# Patient Record
Sex: Male | Born: 1945
Health system: Southern US, Community
[De-identification: ages and names within clinical notes are randomized; demographics above are authoritative.]

## PROBLEM LIST (undated history)

## (undated) DIAGNOSIS — J449 Chronic obstructive pulmonary disease, unspecified: Secondary | ICD-10-CM

## (undated) DIAGNOSIS — I10 Essential (primary) hypertension: Secondary | ICD-10-CM

## (undated) DIAGNOSIS — K5790 Diverticulosis of intestine, part unspecified, without perforation or abscess without bleeding: Secondary | ICD-10-CM

## (undated) DIAGNOSIS — K4090 Unilateral inguinal hernia, without obstruction or gangrene, not specified as recurrent: Secondary | ICD-10-CM

## (undated) DIAGNOSIS — N189 Chronic kidney disease, unspecified: Secondary | ICD-10-CM

## (undated) DIAGNOSIS — IMO0001 Reserved for inherently not codable concepts without codable children: Secondary | ICD-10-CM

## (undated) DIAGNOSIS — Z87442 Personal history of urinary calculi: Secondary | ICD-10-CM

## (undated) HISTORY — PX: LITHOTRIPSY: SUR834

## (undated) HISTORY — PX: EYE SURGERY: SHX253

## (undated) HISTORY — PX: COLONOSCOPY: SHX174

---

## 2008-04-14 ENCOUNTER — Ambulatory Visit: Payer: Self-pay | Admitting: General Practice

## 2014-05-15 DIAGNOSIS — F17200 Nicotine dependence, unspecified, uncomplicated: Secondary | ICD-10-CM | POA: Insufficient documentation

## 2014-05-15 DIAGNOSIS — I10 Essential (primary) hypertension: Secondary | ICD-10-CM | POA: Insufficient documentation

## 2014-07-04 ENCOUNTER — Ambulatory Visit: Payer: Self-pay | Admitting: Gastroenterology

## 2014-12-08 DIAGNOSIS — N529 Male erectile dysfunction, unspecified: Secondary | ICD-10-CM | POA: Insufficient documentation

## 2015-02-03 ENCOUNTER — Encounter
Admission: RE | Admit: 2015-02-03 | Discharge: 2015-02-03 | Disposition: A | Discharge status: Home / Self Care | Payer: PPO | Source: Ambulatory Visit | Attending: Ophthalmology | Admitting: Ophthalmology

## 2015-02-03 ENCOUNTER — Encounter: Payer: Self-pay | Admitting: *Deleted

## 2015-02-03 DIAGNOSIS — Z87442 Personal history of urinary calculi: Secondary | ICD-10-CM | POA: Diagnosis not present

## 2015-02-03 DIAGNOSIS — I10 Essential (primary) hypertension: Secondary | ICD-10-CM | POA: Diagnosis not present

## 2015-02-03 DIAGNOSIS — Z79899 Other long term (current) drug therapy: Secondary | ICD-10-CM | POA: Diagnosis not present

## 2015-02-03 DIAGNOSIS — Z7982 Long term (current) use of aspirin: Secondary | ICD-10-CM | POA: Diagnosis not present

## 2015-02-03 DIAGNOSIS — H35341 Macular cyst, hole, or pseudohole, right eye: Secondary | ICD-10-CM | POA: Diagnosis present

## 2015-02-03 DIAGNOSIS — K5790 Diverticulosis of intestine, part unspecified, without perforation or abscess without bleeding: Secondary | ICD-10-CM | POA: Diagnosis not present

## 2015-02-03 DIAGNOSIS — F17200 Nicotine dependence, unspecified, uncomplicated: Secondary | ICD-10-CM | POA: Diagnosis not present

## 2015-02-03 DIAGNOSIS — Z01810 Encounter for preprocedural cardiovascular examination: Secondary | ICD-10-CM | POA: Diagnosis not present

## 2015-02-03 DIAGNOSIS — R0602 Shortness of breath: Secondary | ICD-10-CM | POA: Diagnosis not present

## 2015-02-03 DIAGNOSIS — J449 Chronic obstructive pulmonary disease, unspecified: Secondary | ICD-10-CM | POA: Diagnosis not present

## 2015-02-10 MED ORDER — PHENYLEPHRINE HCL 10 % OP SOLN: 1.0000 [drp] | OPHTHALMIC | Status: DC | PRN

## 2015-02-10 MED ORDER — CYCLOPENTOLATE HCL 2 % OP SOLN: 1.0000 [drp] | OPHTHALMIC | Status: DC | PRN

## 2015-02-11 ENCOUNTER — Ambulatory Visit: Payer: PPO | Admitting: Certified Registered Nurse Anesthetist

## 2015-02-11 ENCOUNTER — Ambulatory Visit
Admission: RE | Admit: 2015-02-11 | Discharge: 2015-02-11 | Disposition: A | Discharge status: Home / Self Care | Payer: PPO | Source: Ambulatory Visit | Attending: Ophthalmology | Admitting: Ophthalmology

## 2015-02-11 ENCOUNTER — Encounter
Admission: RE | Disposition: A | Discharge status: Home / Self Care | Payer: Self-pay | Source: Ambulatory Visit | Attending: Ophthalmology

## 2015-02-11 ENCOUNTER — Encounter: Payer: Self-pay | Admitting: *Deleted

## 2015-02-11 DIAGNOSIS — Z79899 Other long term (current) drug therapy: Secondary | ICD-10-CM | POA: Insufficient documentation

## 2015-02-11 DIAGNOSIS — J449 Chronic obstructive pulmonary disease, unspecified: Secondary | ICD-10-CM | POA: Insufficient documentation

## 2015-02-11 DIAGNOSIS — H35341 Macular cyst, hole, or pseudohole, right eye: Secondary | ICD-10-CM | POA: Diagnosis not present

## 2015-02-11 DIAGNOSIS — F17200 Nicotine dependence, unspecified, uncomplicated: Secondary | ICD-10-CM | POA: Insufficient documentation

## 2015-02-11 DIAGNOSIS — R0602 Shortness of breath: Secondary | ICD-10-CM | POA: Insufficient documentation

## 2015-02-11 DIAGNOSIS — K5790 Diverticulosis of intestine, part unspecified, without perforation or abscess without bleeding: Secondary | ICD-10-CM | POA: Insufficient documentation

## 2015-02-11 DIAGNOSIS — I10 Essential (primary) hypertension: Secondary | ICD-10-CM | POA: Insufficient documentation

## 2015-02-11 DIAGNOSIS — Z87442 Personal history of urinary calculi: Secondary | ICD-10-CM | POA: Insufficient documentation

## 2015-02-11 DIAGNOSIS — Z7982 Long term (current) use of aspirin: Secondary | ICD-10-CM | POA: Insufficient documentation

## 2015-02-11 HISTORY — PX: GAS/FLUID EXCHANGE: SHX5334

## 2015-02-11 HISTORY — DX: Chronic kidney disease, unspecified: N18.9

## 2015-02-11 HISTORY — PX: MEMBRANE PEEL: SHX5967

## 2015-02-11 HISTORY — DX: Reserved for inherently not codable concepts without codable children: IMO0001

## 2015-02-11 HISTORY — DX: Essential (primary) hypertension: I10

## 2015-02-11 HISTORY — DX: Chronic obstructive pulmonary disease, unspecified: J44.9

## 2015-02-11 HISTORY — PX: PARS PLANA VITRECTOMY W/ SCLERAL BUCKLE: SHX2171

## 2015-02-11 SURGERY — PARS PLANA VITRECTOMY WITH LASER FOR MACULAR HOLE
Anesthesia: Monitor Anesthesia Care | Laterality: Right | Wound class: Clean

## 2015-02-11 MED ORDER — BUPIVACAINE HCL (PF) 0.75 % IJ SOLN
INTRAMUSCULAR | Status: AC
  Filled 2015-02-11: qty 10

## 2015-02-11 MED ORDER — ONDANSETRON HCL 4 MG/2ML IJ SOLN: 4.0000 mg | Freq: Once | INTRAMUSCULAR | Status: DC | PRN

## 2015-02-11 MED ORDER — ALFENTANIL 500 MCG/ML IJ INJ
INJECTION | INTRAMUSCULAR | Status: DC | PRN
  Administered 2015-02-11: 250 ug via INTRAVENOUS

## 2015-02-11 MED ORDER — CYCLOPENTOLATE HCL 2 % OP SOLN
OPHTHALMIC | Status: AC
  Administered 2015-02-11: 1 [drp] via OPHTHALMIC
  Filled 2015-02-11: qty 2

## 2015-02-11 MED ORDER — PHENYLEPHRINE HCL 10 % OP SOLN
OPHTHALMIC | Status: AC
  Administered 2015-02-11: 1 [drp] via OPHTHALMIC
  Filled 2015-02-11: qty 5

## 2015-02-11 MED ORDER — CEFUROXIME OPHTHALMIC INJECTION 1 MG/0.1 ML
INJECTION | OPHTHALMIC | Status: AC
  Filled 2015-02-11: qty 0.1

## 2015-02-11 MED ORDER — ATROPINE SULFATE 1 % OP SOLN
OPHTHALMIC | Status: AC
  Filled 2015-02-11: qty 5

## 2015-02-11 MED ORDER — MIDAZOLAM HCL 2 MG/2ML IJ SOLN
INTRAMUSCULAR | Status: DC | PRN
  Administered 2015-02-11 (×2): 1 mg via INTRAVENOUS

## 2015-02-11 MED ORDER — ATROPINE SULFATE 1 % OP SOLN
OPHTHALMIC | Status: DC | PRN
  Administered 2015-02-11: 2 [drp] via OPHTHALMIC

## 2015-02-11 MED ORDER — NEOMYCIN-POLYMYXIN-DEXAMETH 0.1 % OP OINT
TOPICAL_OINTMENT | OPHTHALMIC | Status: DC | PRN
  Administered 2015-02-11: 1 via OPHTHALMIC

## 2015-02-11 MED ORDER — TETRACAINE HCL 0.5 % OP SOLN
OPHTHALMIC | Status: AC
  Filled 2015-02-11: qty 2

## 2015-02-11 MED ORDER — FENTANYL CITRATE (PF) 100 MCG/2ML IJ SOLN: 25.0000 ug | INTRAMUSCULAR | Status: DC | PRN

## 2015-02-11 MED ORDER — DEXAMETHASONE SODIUM PHOSPHATE 10 MG/ML IJ SOLN
INTRAMUSCULAR | Status: AC
  Filled 2015-02-11: qty 1

## 2015-02-11 MED ORDER — PHENYLEPHRINE HCL 10 % OP SOLN
1.0000 [drp] | OPHTHALMIC | Status: AC | PRN
  Administered 2015-02-11 (×3): 1 [drp] via OPHTHALMIC

## 2015-02-11 MED ORDER — INDOCYANINE GREEN 25 MG IV SOLR
25.0000 mg | Freq: Once | INTRAVENOUS | Status: DC
  Filled 2015-02-11: qty 25

## 2015-02-11 MED ORDER — LIDOCAINE HCL (PF) 4 % IJ SOLN
INTRAMUSCULAR | Status: DC | PRN
  Administered 2015-02-11: 5 mL via OPHTHALMIC

## 2015-02-11 MED ORDER — SODIUM CHLORIDE 0.9 % IV SOLN
INTRAVENOUS | Status: DC
  Administered 2015-02-11: 08:00:00 via INTRAVENOUS

## 2015-02-11 MED ORDER — BSS PLUS IO SOLN
Freq: Once | INTRAOCULAR | Status: DC
  Filled 2015-02-11: qty 500

## 2015-02-11 MED ORDER — CEFUROXIME OPHTHALMIC INJECTION 1 MG/0.1 ML
INJECTION | OPHTHALMIC | Status: DC | PRN
  Administered 2015-02-11: 0.1 mL via INTRACAMERAL

## 2015-02-11 MED ORDER — INDOCYANINE GREEN 25 MG IV SOLR
INTRAVENOUS | Status: DC | PRN
  Administered 2015-02-11: 2.5 mg

## 2015-02-11 MED ORDER — DEXAMETHASONE SODIUM PHOSPHATE 10 MG/ML IJ SOLN
INTRAMUSCULAR | Status: DC | PRN
  Administered 2015-02-11: 5 mg

## 2015-02-11 MED ORDER — HYALURONIDASE HUMAN 150 UNIT/ML IJ SOLN
INTRAMUSCULAR | Status: AC
Start: 2015-02-11 — End: 2015-02-11
  Filled 2015-02-11: qty 1

## 2015-02-11 MED ORDER — LIDOCAINE HCL (PF) 4 % IJ SOLN
INTRAMUSCULAR | Status: AC
  Filled 2015-02-11: qty 5

## 2015-02-11 MED ORDER — CYCLOPENTOLATE HCL 2 % OP SOLN
1.0000 [drp] | OPHTHALMIC | Status: AC | PRN
  Administered 2015-02-11 (×3): 1 [drp] via OPHTHALMIC

## 2015-02-11 SURGICAL SUPPLY — 31 items
CANNULA SOFT TIP 25G (CANNULA) ×3 IMPLANT
CORD BIP STRL DISP 12FT (MISCELLANEOUS) ×3 IMPLANT
CUP MEDICINE 2OZ PLAST GRAD ST (MISCELLANEOUS) ×3 IMPLANT
DRAPE XRAY CASSETTE 23X24 (DRAPES) ×6 IMPLANT
ERASER HMR WETFIELD 25G (MISCELLANEOUS) ×3 IMPLANT
FILTER MILLEX .045 (MISCELLANEOUS) ×1 IMPLANT
FLTR MILLEX .045 (MISCELLANEOUS) ×3
FORCEPS GRIESH GRASP 25G (INSTRUMENTS) ×3 IMPLANT
FORCEPS GRIESH ILM PLUS 25G (INSTRUMENTS) ×3 IMPLANT
GLOVE BIO SURGEON STRL SZ8 (GLOVE) ×3 IMPLANT
GLOVE SURG LX 6.5 MICRO (GLOVE) ×2
GLOVE SURG LX STRL 6.5 MICRO (GLOVE) ×1 IMPLANT
GOWN STRL REUS W/ TWL LRG LVL3 (GOWN DISPOSABLE) ×2 IMPLANT
GOWN STRL REUS W/TWL LRG LVL3 (GOWN DISPOSABLE) ×4
IV SET STOPCOCK EXT 40 2IN (SET/KITS/TRAYS/PACK) ×3 IMPLANT
LENS BIOM OPTIC SET 200MM DISP (MISCELLANEOUS) ×3 IMPLANT
LENS VITRECTOMY FLAT DISP (MISCELLANEOUS) ×3 IMPLANT
NDL RETROBULBAR .5 NSTRL (NEEDLE) ×3 IMPLANT
NDL SAFETY 25GX1.5 (NEEDLE) ×3 IMPLANT
NEEDLE HYPO 30X.5 LL (NEEDLE) ×3 IMPLANT
PACK EYE AFTER SURG (MISCELLANEOUS) ×3 IMPLANT
PACK VITRECTOMY (MISCELLANEOUS) ×3 IMPLANT
PACK VITRECTOMY CASSETTE 25GA (MISCELLANEOUS) ×3 IMPLANT
PROBE DIRECTIONAL LASER (MISCELLANEOUS) ×3 IMPLANT
SOL PREP PVP 2OZ (MISCELLANEOUS) ×3
SOLUTION PREP PVP 2OZ (MISCELLANEOUS) ×1 IMPLANT
STRAP SAFETY BODY (MISCELLANEOUS) ×3 IMPLANT
SYR 3ML LL SCALE MARK (SYRINGE) ×6 IMPLANT
SYR 50ML LL SCALE MARK (SYRINGE) ×2 IMPLANT
SYR TB 1ML 27GX1/2 LL (SYRINGE) ×3 IMPLANT
SYR TB 1ML LUER SLIP (SYRINGE) ×3 IMPLANT

## 2015-02-11 NOTE — Transfer of Care (Signed)
Immediate Anesthesia Transfer of Care Note  Patient: Manuel Bowen.  Procedure(s) Performed: Procedure(s) with comments: GAS/FLUID EXCHANGE (Right) - 24%  SF6 MEMBRANE PEEL (Right) PARS PLANA VITRECTOMY WITH LASER FOR MACULAR HOLE (Right) - Power200 Duration200 Interval200 Shot count117 Total energy 4.61 joules  Patient Location: PACU  Anesthesia Type:MAC  Level of Consciousness: awake, alert  and oriented  Airway & Oxygen Therapy: Patient Spontanous Breathing  Post-op Assessment: Report given to RN and Post -op Vital signs reviewed and stable  Post vital signs: Reviewed and stable  Last Vitals:  Filed Vitals:   02/11/15 0934  BP: 102/54  Pulse: 70  Temp: 36.2 C  Resp: 21    Complications: No apparent anesthesia complications

## 2015-02-11 NOTE — Anesthesia Preprocedure Evaluation (Addendum)
Anesthesia Evaluation  Patient identified by MRN, date of birth, ID band Patient awake    Reviewed: Allergy & Precautions, NPO status , Patient's Chart, lab work & pertinent test results  Airway Mallampati: III       Dental no notable dental hx.    Pulmonary shortness of breath, COPDCurrent Smoker,  + rhonchi   + decreased breath sounds      Cardiovascular hypertension, Pt. on medications Normal cardiovascular exam    Neuro/Psych    GI/Hepatic negative GI ROS, Neg liver ROS,   Endo/Other  negative endocrine ROS  Renal/GU Renal disease     Musculoskeletal negative musculoskeletal ROS (+)   Abdominal Normal abdominal exam  (+)   Peds negative pediatric ROS (+)  Hematology negative hematology ROS (+)   Anesthesia Other Findings   Reproductive/Obstetrics negative OB ROS                            Anesthesia Physical Anesthesia Plan  ASA: III  Anesthesia Plan: MAC   Post-op Pain Management:    Induction: Intravenous  Airway Management Planned: Nasal Cannula  Additional Equipment:   Intra-op Plan:   Post-operative Plan:   Informed Consent: I have reviewed the patients History and Physical, chart, labs and discussed the procedure including the risks, benefits and alternatives for the proposed anesthesia with the patient or authorized representative who has indicated his/her understanding and acceptance.     Plan Discussed with: CRNA  Anesthesia Plan Comments:         Anesthesia Quick Evaluation

## 2015-02-11 NOTE — Op Note (Signed)
Indications FOR SURGERY: The patient was evaluated in the clinic for a full thickness macular hole in the right eye.  The risks, benefits, alternatives, and complications were discussed with the patient, and he elected to proceed with pars plana vitrectomy.        PREOPERATIVE DIAGNOSIS: Macular hole, right eye.            POST OPERATIVE DIAGNOSIS: Macular hole, right eye.               OPERATION PERFORMED: 25-gauge pars plana vitrectomy with ICG, membrane peel, endolaser, air-fluid exchange, and 24% SF6.                  ANESTHESIA: MAC with retrobulbar block.   COMPLICATIONS: None.     BLOOD LOSS: Minimal.   DESCRIPTION OF PROCEDURE: On the day of surgery, the patient was greeted in  the preoperative holding area.  The right eye was marked.  The patient was then brought into the operating room and placed under monitored anesthesia care.  Next, 5 ml of a retrobulbar block consisting of 4% xylocaine plain, 0.75% sensorcaine plain and hylenex 100u was injected. The right eye was then prepped and draped in the usual sterile fashion.  Three trocars were used in the usual position. The infusion cannula was checked prior to starting the infusion to ensure it was in the correct position.  A core vitrectomy was performed. A PVD was present and so the vitreous was trimmed for 360 degrees to the periphery.   ICG was then applied to the retinal surface.  ICG forceps were used to peel the epiretinal membrane and internal limiting membrane for an area of two disc diameters around the fovea.  Attention was then drawn to the periphery.   A 360 degree scleral depression was performed.  There was a small area of retinal traction without a frank tear, but the decision was made to laser barricade prophylactically.  An air-fluid exchange was then performed and four times the vitreous volume of 24% SF6 was infused into the vitreous cavity.  The trocars were then removed.  The eye had an acceptable pressure by palpation.  The  wounds were airtight.  Subconjunctival cefuroxime and dexamethasone was injected.  The eye was then patched and shielded with Neo-Poly-Dex ointment and the patient was taken to the recovery area in stable condition.

## 2015-02-11 NOTE — H&P (Signed)
.  Previous H&P scanned in reviewed, patient examined, and no interval changes.  Please see scanned record for complete information.   

## 2015-02-11 NOTE — Discharge Instructions (Signed)
AMBULATORY SURGERY  °DISCHARGE INSTRUCTIONS ° ° °1) The drugs that you were given will stay in your system until tomorrow so for the next 24 hours you should not: ° °A) Drive an automobile °B) Make any legal decisions °C) Drink any alcoholic beverage ° ° °2) You may resume regular meals tomorrow.  Today it is better to start with liquids and gradually work up to solid foods. ° °You may eat anything you prefer, but it is better to start with liquids, then soup and crackers, and gradually work up to solid foods. ° ° °3) Please notify your doctor immediately if you have any unusual bleeding, trouble breathing, redness and pain at the surgery site, drainage, fever, or pain not relieved by medication. ° ° ° °4) Additional Instructions: ° ° ° ° ° ° ° °Please contact your physician with any problems or Same Day Surgery at [phone], Monday through Friday 6 am to 4 pm, or [place] [facility] [place] Main number at [phone].AMBULATORY SURGERY  °DISCHARGE INSTRUCTIONS ° ° °5) The drugs that you were given will stay in your system until tomorrow so for the next 24 hours you should not: ° °D) Drive an automobile °E) Make any legal decisions °F) Drink any alcoholic beverage ° ° °6) You may resume regular meals tomorrow.  Today it is better to start with liquids and gradually work up to solid foods. ° °You may eat anything you prefer, but it is better to start with liquids, then soup and crackers, and gradually work up to solid foods. ° ° °7) Please notify your doctor immediately if you have any unusual bleeding, trouble breathing, redness and pain at the surgery site, drainage, fever, or pain not relieved by medication. ° ° ° °8) Additional Instructions: ° ° ° ° ° ° ° °Please contact your physician with any problems or Same Day Surgery at [phone], Monday through Friday 6 am to 4 pm, or [facility] [place] Main number at [phone]. °

## 2015-02-11 NOTE — Anesthesia Postprocedure Evaluation (Signed)
  Anesthesia Post-op Note  Patient: Manuel Bowen.  Procedure(s) Performed: Procedure(s) with comments: GAS/FLUID EXCHANGE (Right) - 24%  SF6 MEMBRANE PEEL (Right) PARS PLANA VITRECTOMY WITH LASER FOR MACULAR HOLE (Right) - Power200 Duration200 Interval200 Shot count117 Total energy 4.61 joules  Anesthesia type:MAC  Patient location: PACU  Post pain: Pain level controlled  Post assessment: Post-op Vital signs reviewed, Patient's Cardiovascular Status Stable, Respiratory Function Stable, Patent Airway and No signs of Nausea or vomiting  Post vital signs: Reviewed and stable  Last Vitals:  Filed Vitals:   02/11/15 0945  BP: 104/52  Pulse: 60  Temp:   Resp: 18    Level of consciousness: awake, alert  and patient cooperative  Complications: No apparent anesthesia complications

## 2015-02-11 NOTE — OR Nursing (Signed)
[place]ente[place] Used

## 2015-06-23 ENCOUNTER — Encounter: Payer: Self-pay | Admitting: *Deleted

## 2015-06-25 ENCOUNTER — Ambulatory Visit: Payer: PPO | Admitting: Anesthesiology

## 2015-06-25 ENCOUNTER — Ambulatory Visit
Admission: RE | Admit: 2015-06-25 | Discharge: 2015-06-25 | Disposition: A | Discharge status: Home / Self Care | Payer: PPO | Source: Ambulatory Visit | Attending: Ophthalmology | Admitting: Ophthalmology

## 2015-06-25 ENCOUNTER — Encounter: Payer: Self-pay | Admitting: *Deleted

## 2015-06-25 ENCOUNTER — Encounter
Admission: RE | Disposition: A | Discharge status: Home / Self Care | Payer: Self-pay | Source: Ambulatory Visit | Attending: Ophthalmology

## 2015-06-25 DIAGNOSIS — J449 Chronic obstructive pulmonary disease, unspecified: Secondary | ICD-10-CM | POA: Insufficient documentation

## 2015-06-25 DIAGNOSIS — Z7982 Long term (current) use of aspirin: Secondary | ICD-10-CM | POA: Insufficient documentation

## 2015-06-25 DIAGNOSIS — F17200 Nicotine dependence, unspecified, uncomplicated: Secondary | ICD-10-CM | POA: Diagnosis not present

## 2015-06-25 DIAGNOSIS — Z87442 Personal history of urinary calculi: Secondary | ICD-10-CM | POA: Diagnosis not present

## 2015-06-25 DIAGNOSIS — H2511 Age-related nuclear cataract, right eye: Secondary | ICD-10-CM | POA: Insufficient documentation

## 2015-06-25 DIAGNOSIS — Z79899 Other long term (current) drug therapy: Secondary | ICD-10-CM | POA: Diagnosis not present

## 2015-06-25 DIAGNOSIS — K5730 Diverticulosis of large intestine without perforation or abscess without bleeding: Secondary | ICD-10-CM | POA: Diagnosis not present

## 2015-06-25 HISTORY — PX: CATARACT EXTRACTION W/PHACO: SHX586

## 2015-06-25 SURGERY — PHACOEMULSIFICATION, CATARACT, WITH IOL INSERTION
Anesthesia: Monitor Anesthesia Care | Site: Eye | Laterality: Right | Wound class: Clean

## 2015-06-25 MED ORDER — TETRACAINE HCL 0.5 % OP SOLN
OPHTHALMIC | Status: AC
  Filled 2015-06-25: qty 2

## 2015-06-25 MED ORDER — TETRACAINE HCL 0.5 % OP SOLN
1.0000 [drp] | Freq: Once | OPHTHALMIC | Status: AC
  Administered 2015-06-25: 1 [drp] via OPHTHALMIC

## 2015-06-25 MED ORDER — CEFUROXIME OPHTHALMIC INJECTION 1 MG/0.1 ML
INJECTION | OPHTHALMIC | Status: AC
  Filled 2015-06-25: qty 0.1

## 2015-06-25 MED ORDER — MOXIFLOXACIN HCL 0.5 % OP SOLN
OPHTHALMIC | Status: DC
Start: 2015-06-25 — End: 2015-06-25
  Filled 2015-06-25: qty 3

## 2015-06-25 MED ORDER — EPINEPHRINE HCL 1 MG/ML IJ SOLN
INTRAMUSCULAR | Status: AC
  Filled 2015-06-25: qty 1

## 2015-06-25 MED ORDER — PHENYLEPHRINE HCL 10 % OP SOLN
OPHTHALMIC | Status: DC
Start: 2015-06-25 — End: 2015-06-25
  Filled 2015-06-25: qty 5

## 2015-06-25 MED ORDER — CEFUROXIME OPHTHALMIC INJECTION 1 MG/0.1 ML
INJECTION | OPHTHALMIC | Status: DC | PRN
  Administered 2015-06-25: 0.1 mL via INTRACAMERAL

## 2015-06-25 MED ORDER — NA CHONDROIT SULF-NA HYALURON 40-30 MG/ML IO SOLN
INTRAOCULAR | Status: DC | PRN
  Administered 2015-06-25: 0.5 mL via INTRAOCULAR

## 2015-06-25 MED ORDER — FENTANYL CITRATE (PF) 100 MCG/2ML IJ SOLN
INTRAMUSCULAR | Status: DC | PRN
  Administered 2015-06-25: 50 ug via INTRAVENOUS

## 2015-06-25 MED ORDER — SODIUM CHLORIDE 0.9 % IV SOLN
INTRAVENOUS | Status: DC
Start: 2015-06-25 — End: 2015-06-25
  Administered 2015-06-25: 07:00:00 via INTRAVENOUS

## 2015-06-25 MED ORDER — MOXIFLOXACIN HCL 0.5 % OP SOLN
OPHTHALMIC | Status: DC | PRN
  Administered 2015-06-25: 1 [drp] via OPHTHALMIC

## 2015-06-25 MED ORDER — CYCLOPENTOLATE HCL 2 % OP SOLN
1.0000 [drp] | OPHTHALMIC | Status: DC | PRN
Start: 2015-06-25 — End: 2015-06-25
  Administered 2015-06-25: 1 [drp] via OPHTHALMIC

## 2015-06-25 MED ORDER — MIDAZOLAM HCL 2 MG/2ML IJ SOLN
INTRAMUSCULAR | Status: DC | PRN
  Administered 2015-06-25: 1 mg via INTRAVENOUS

## 2015-06-25 MED ORDER — PHENYLEPHRINE HCL 10 % OP SOLN
1.0000 [drp] | OPHTHALMIC | Status: DC | PRN
  Administered 2015-06-25: 1 [drp] via OPHTHALMIC

## 2015-06-25 MED ORDER — MOXIFLOXACIN HCL 0.5 % OP SOLN
1.0000 [drp] | OPHTHALMIC | Status: DC | PRN
  Administered 2015-06-25: 1 [drp] via OPHTHALMIC

## 2015-06-25 MED ORDER — CYCLOPENTOLATE HCL 2 % OP SOLN
OPHTHALMIC | Status: AC
  Filled 2015-06-25: qty 2

## 2015-06-25 MED ORDER — NA HYALUR & NA CHOND-NA HYALUR 0.55-0.5 ML IO KIT
PACK | INTRAOCULAR | Status: AC
  Filled 2015-06-25: qty 1.05

## 2015-06-25 MED ORDER — NA CHONDROIT SULF-NA HYALURON 40-30 MG/ML IO SOLN
INTRAOCULAR | Status: AC
  Filled 2015-06-25: qty 0.5

## 2015-06-25 MED ORDER — LIDOCAINE HCL (PF) 4 % IJ SOLN
INTRAMUSCULAR | Status: AC
  Filled 2015-06-25: qty 5

## 2015-06-25 MED ORDER — LIDOCAINE HCL (PF) 4 % IJ SOLN
INTRAMUSCULAR | Status: DC | PRN
  Administered 2015-06-25: 4 mL via OPHTHALMIC

## 2015-06-25 SURGICAL SUPPLY — 24 items
CANNULA ANT/CHMB 27GA (MISCELLANEOUS) ×2 IMPLANT
CUP MEDICINE 2OZ PLAST GRAD ST (MISCELLANEOUS) ×2 IMPLANT
GLOVE BIO SURGEON STRL SZ7 (GLOVE) ×2 IMPLANT
GLOVE SURG LX 6.5 MICRO (GLOVE) ×2
GLOVE SURG LX STRL 6.5 MICRO (GLOVE) ×2 IMPLANT
GOWN STRL REUS W/ TWL LRG LVL3 (GOWN DISPOSABLE) ×2 IMPLANT
GOWN STRL REUS W/TWL LRG LVL3 (GOWN DISPOSABLE) ×2
LENS IOL ACRSF IQ PC 15.5 (Intraocular Lens) ×1 IMPLANT
LENS IOL ACRYSOF IQ POST 15.5 (Intraocular Lens) ×2 IMPLANT
NEEDLE FILTER BLUNT 18X 1/2SAF (NEEDLE) ×1
NEEDLE FILTER BLUNT 18X1 1/2 (NEEDLE) ×1 IMPLANT
PACK CATARACT (MISCELLANEOUS) ×2 IMPLANT
PACK CATARACT BRASINGTON LX (MISCELLANEOUS) ×2 IMPLANT
PACK EYE AFTER SURG (MISCELLANEOUS) ×2 IMPLANT
SOL BAL SALT 15ML (MISCELLANEOUS) ×2
SOL BSS BAG (MISCELLANEOUS) ×2
SOL PREP PVP 2OZ (MISCELLANEOUS) ×2
SOLUTION BAL SALT 15ML (MISCELLANEOUS) ×1 IMPLANT
SOLUTION BSS BAG (MISCELLANEOUS) ×1 IMPLANT
SOLUTION PREP PVP 2OZ (MISCELLANEOUS) ×1 IMPLANT
SYR 3ML LL SCALE MARK (SYRINGE) ×4 IMPLANT
SYR TB 1ML 27GX1/2 LL (SYRINGE) ×2 IMPLANT
WATER STERILE IRR 1000ML POUR (IV SOLUTION) ×2 IMPLANT
WIPE NON LINTING 3.25X3.25 (MISCELLANEOUS) ×2 IMPLANT

## 2015-06-25 NOTE — Op Note (Signed)
  06/25/2015  PRE-OP DIAGNOSIS: Cataract (ICD-10 H25.11) Nuclear sclerotic catarct, RIGHT EYE  Post operative diagnosis: Cataract (ICD-10 H25.11) Nuclear sclerotic cataract, RIGHT EYE  Procedure: Phacoemulsification with introcular lens [id number])   SURGEON: Surgeon(s) and Role:    * Lia HoppingNisha Zenovia Justman, MD - Primary  ANESTHESIA: Topical   ESTIMATED BLOOD LOSS: MINIMAL  COMPLICATIONS: None  OPERATIVE DESCRIPTION:   Therapeutic options were discussed with the patient preoperatively, including a discussion of risks and benefits of surgery.  Informed consent was obtained. A dilated fundus exam was performed within 6 months.   The patient was premedicated and brought to the operating room and placed on the operating table in the supine position.  Topical tetracaine was instilled.  After adequate anesthesia, the patient was prepped and draped in the usual fashion.  A wire lid speculum was inserted and the microscope was positioned.  A sideport was used to create a paracentesis site and a mixture of preservative-free lidocaine, BSS, and epinephrine was was instilled into the anterior chamber, followed by viscoelastic.  A clear corneal incision was created using a keratome blade.  Capsulorrhexis was then performed.  In situ phacoemulsification was performed.  Cortical material was removed with the irrigation-aspiration unit.  Viscoelastic was instilled to open the capsular bag.  A posterior chamber intraocular lens, model 15.5 diopters, was inserted and positioned.  Irrigation-aspiration was used to remove all viscoelastic. Intracameral cefuroxime was injected into the eye. Wounds were checked for leakage and confirmed to be secure.  Lid speculum was removed and a shield was placed over the eye.  Patient was returned to the recovery room in stable condition. IMPLANTS:   Implant Name Type Inv. Item Serial No. Manufacturer Lot No. LRB No. Used  IMPLANT LENS SN60WF 15.5 - [id number] Intraocular  Lens IMPLANT LENS SN60WF 15.5 [id number] Judithann SheenLCON [id number] Right 1     Postoperative care and discharge medication counseling was discussed with the patient or the parents prior to discharge

## 2015-06-25 NOTE — Discharge Instructions (Signed)
POST OPERATIVE INSTRUCTIONS  °DAY OF CATARACT SURGERY °Your surgery went well.  °Today, take it easy and protect the eye.  °Don’t bend over at the waist. °Don’t lift objects heavier than a jug of milk (10lbs). °Don’t let anything get in the eye other than the drops we give you. °Keep the eye shield on at all times except to put in the drops. °You may have a mild headache, soreness or scratchy sensation after surgery. °EYE DROPS AFTER SURGERY ° °        Durezol °Steroid (SHAKE WELL) Ilevro °Anti-inflammatory Vigamox °Antibiotic °  °    2 times a day Once daily 4 times a day  °   ° Wait 5 minutes between drops  °If you have questions, call [facility] °We will see you tomorrow in the eye clinic. ° °

## 2015-06-25 NOTE — Anesthesia Procedure Notes (Signed)
Procedure Name: MAC Date/Time: 06/25/2015 7:30 AM Performed by: Henrietta HooverPOPE, Kadija Cruzen Pre-anesthesia Checklist: Patient identified, Emergency Drugs available, Suction available, Patient being monitored and Timeout performed Patient Re-evaluated:Patient Re-evaluated prior to inductionOxygen Delivery Method: Nasal cannula

## 2015-06-25 NOTE — Transfer of Care (Signed)
Immediate Anesthesia Transfer of Care Note  Patient: Cecilie KicksBernard D Knights Jr.  Procedure(s) Performed: Procedure(s) with comments: CATARACT EXTRACTION PHACO AND INTRAOCULAR LENS PLACEMENT (IOC) (Right) - US: 01:23.0 AP%: 13.0 CDE: 10.75 Lot # [id number]  Patient Location: PACU  Anesthesia Type:MAC  Level of Consciousness: sedated  Airway & Oxygen Therapy: Patient Spontanous Breathing and Patient connected to nasal cannula oxygen  Post-op Assessment: Report given to RN and Post -op Vital signs reviewed and stable  Post vital signs: Reviewed and stable  Last Vitals:  Filed Vitals:   06/25/15 0623  BP: 145/75  Pulse: 98  Temp: 36.3 C  Resp: 16    Complications: No apparent anesthesia complications

## 2015-06-25 NOTE — Anesthesia Postprocedure Evaluation (Signed)
Anesthesia Post Note  Patient: Manuel KicksBernard D Spittler Jr.  Procedure(s) Performed: Procedure(s) (LRB): CATARACT EXTRACTION PHACO AND INTRAOCULAR LENS PLACEMENT (IOC) (Right)  Patient location during evaluation: PACU Anesthesia Type: MAC Level of consciousness: awake and alert Pain management: pain level controlled Vital Signs Assessment: post-procedure vital signs reviewed and stable Respiratory status: spontaneous breathing and nonlabored ventilation Anesthetic complications: no    Last Vitals:  Filed Vitals:   06/25/15 0623  BP: 145/75  Pulse: 98  Temp: 36.3 C  Resp: 16    Last Pain: There were no vitals filed for this visit.               Vernie MurdersPope,  Myrakle Wingler G

## 2015-06-25 NOTE — Anesthesia Preprocedure Evaluation (Signed)
Anesthesia Evaluation  Patient identified by MRN, date of birth, ID band Patient awake    Reviewed: Allergy & Precautions, NPO status , Patient's Chart, lab work & pertinent test results  History of Anesthesia Complications Negative for: history of anesthetic complications  Airway Mallampati: II       Dental  (+) Missing   Pulmonary COPD, Current Smoker,           Cardiovascular hypertension, Pt. on medications      Neuro/Psych negative neurological ROS     GI/Hepatic negative GI ROS, Neg liver ROS,   Endo/Other  negative endocrine ROS  Renal/GU Renal disease (stones)     Musculoskeletal   Abdominal   Peds  Hematology negative hematology ROS (+)   Anesthesia Other Findings   Reproductive/Obstetrics                             Anesthesia Physical Anesthesia Plan  ASA: III  Anesthesia Plan: MAC   Post-op Pain Management:    Induction: Intravenous  Airway Management Planned: Nasal Cannula  Additional Equipment:   Intra-op Plan:   Post-operative Plan:   Informed Consent: I have reviewed the patients History and Physical, chart, labs and discussed the procedure including the risks, benefits and alternatives for the proposed anesthesia with the patient or authorized representative who has indicated his/her understanding and acceptance.     Plan Discussed with:   Anesthesia Plan Comments:         Anesthesia Quick Evaluation

## 2015-06-25 NOTE — H&P (Signed)
The history and physical was faxed to the hospital. The history and physical was reviewed by me and no changes have occurred.   

## 2015-07-02 NOTE — Addendum Note (Signed)
Addendum  created 07/02/15 1642 by Naomie DeanWilliam K Yoshito Gaza, MD   Modules edited: Anesthesia Responsible Staff

## 2015-07-31 DIAGNOSIS — H35341 Macular cyst, hole, or pseudohole, right eye: Secondary | ICD-10-CM | POA: Diagnosis not present

## 2015-12-21 DIAGNOSIS — I10 Essential (primary) hypertension: Secondary | ICD-10-CM | POA: Diagnosis not present

## 2015-12-21 DIAGNOSIS — F17200 Nicotine dependence, unspecified, uncomplicated: Secondary | ICD-10-CM | POA: Diagnosis not present

## 2016-02-02 DIAGNOSIS — H2512 Age-related nuclear cataract, left eye: Secondary | ICD-10-CM | POA: Diagnosis not present

## 2016-03-25 DIAGNOSIS — H2512 Age-related nuclear cataract, left eye: Secondary | ICD-10-CM | POA: Diagnosis not present

## 2016-04-07 ENCOUNTER — Encounter: Payer: Self-pay | Admitting: *Deleted

## 2016-04-13 MED ORDER — ARMC OPHTHALMIC DILATING GEL: 1.0000 "application " | OPHTHALMIC | Status: DC | PRN

## 2016-04-13 MED ORDER — POVIDONE-IODINE 5 % OP SOLN: 1.0000 "application " | Freq: Once | OPHTHALMIC | Status: DC

## 2016-04-13 MED ORDER — TETRACAINE HCL 0.5 % OP SOLN: 1.0000 [drp] | Freq: Once | OPHTHALMIC | Status: DC

## 2016-04-13 MED ORDER — MOXIFLOXACIN HCL 0.5 % OP SOLN
1.0000 [drp] | OPHTHALMIC | Status: DC | PRN
Start: 2016-04-13 — End: 2016-04-13

## 2016-04-14 ENCOUNTER — Ambulatory Visit
Admission: RE | Admit: 2016-04-14 | Discharge: 2016-04-14 | Disposition: A | Discharge status: Home / Self Care | Payer: PPO | Source: Ambulatory Visit | Attending: Ophthalmology | Admitting: Ophthalmology

## 2016-04-14 ENCOUNTER — Encounter
Admission: RE | Disposition: A | Discharge status: Home / Self Care | Payer: Self-pay | Source: Ambulatory Visit | Attending: Ophthalmology

## 2016-04-14 ENCOUNTER — Ambulatory Visit: Payer: PPO | Admitting: Anesthesiology

## 2016-04-14 ENCOUNTER — Encounter: Payer: Self-pay | Admitting: *Deleted

## 2016-04-14 DIAGNOSIS — F17200 Nicotine dependence, unspecified, uncomplicated: Secondary | ICD-10-CM | POA: Diagnosis not present

## 2016-04-14 DIAGNOSIS — H2512 Age-related nuclear cataract, left eye: Secondary | ICD-10-CM | POA: Insufficient documentation

## 2016-04-14 DIAGNOSIS — J449 Chronic obstructive pulmonary disease, unspecified: Secondary | ICD-10-CM | POA: Insufficient documentation

## 2016-04-14 DIAGNOSIS — Z7982 Long term (current) use of aspirin: Secondary | ICD-10-CM | POA: Insufficient documentation

## 2016-04-14 DIAGNOSIS — I10 Essential (primary) hypertension: Secondary | ICD-10-CM | POA: Insufficient documentation

## 2016-04-14 HISTORY — PX: CATARACT EXTRACTION W/PHACO: SHX586

## 2016-04-14 SURGERY — PHACOEMULSIFICATION, CATARACT, WITH IOL INSERTION
Anesthesia: Monitor Anesthesia Care | Site: Eye | Laterality: Left | Wound class: Clean

## 2016-04-14 MED ORDER — MOXIFLOXACIN HCL 0.5 % OP SOLN
1.0000 [drp] | OPHTHALMIC | Status: AC
  Administered 2016-04-14 (×2): 1 [drp] via OPHTHALMIC

## 2016-04-14 MED ORDER — CYCLOPENTOLATE HCL 2 % OP SOLN
OPHTHALMIC | Status: AC
  Filled 2016-04-14: qty 2

## 2016-04-14 MED ORDER — MOXIFLOXACIN HCL 0.5 % OP SOLN
OPHTHALMIC | Status: AC
  Filled 2016-04-14: qty 3

## 2016-04-14 MED ORDER — LIDOCAINE HCL 3.5 % OP GEL: 1.0000 "application " | Freq: Once | OPHTHALMIC | Status: DC

## 2016-04-14 MED ORDER — TETRACAINE HCL 0.5 % OP SOLN
OPHTHALMIC | Status: AC
  Filled 2016-04-14: qty 2

## 2016-04-14 MED ORDER — TRYPAN BLUE 0.06 % OP SOLN
OPHTHALMIC | Status: AC
Start: 2016-04-14 — End: 2016-04-14
  Filled 2016-04-14: qty 0.5

## 2016-04-14 MED ORDER — SODIUM HYALURONATE 23 MG/ML IO SOLN
INTRAOCULAR | Status: AC
  Filled 2016-04-14: qty 0.6

## 2016-04-14 MED ORDER — SODIUM CHLORIDE 0.9 % IV SOLN
INTRAVENOUS | Status: DC
  Administered 2016-04-14 (×2): via INTRAVENOUS

## 2016-04-14 MED ORDER — TETRACAINE HCL 0.5 % OP SOLN
1.0000 [drp] | Freq: Once | OPHTHALMIC | Status: AC
  Administered 2016-04-14: 1 [drp] via OPHTHALMIC

## 2016-04-14 MED ORDER — SODIUM HYALURONATE 23 MG/ML IO SOLN
INTRAOCULAR | Status: DC | PRN
  Administered 2016-04-14: 0.6 mL via INTRAOCULAR

## 2016-04-14 MED ORDER — LIDOCAINE HCL 3.5 % OP GEL
OPHTHALMIC | Status: AC
  Filled 2016-04-14: qty 1

## 2016-04-14 MED ORDER — LIDOCAINE HCL (PF) 4 % IJ SOLN
INTRAMUSCULAR | Status: AC
Start: 2016-04-14 — End: 2016-04-14
  Filled 2016-04-14: qty 5

## 2016-04-14 MED ORDER — EPINEPHRINE HCL 1 MG/ML IJ SOLN
INTRAOCULAR | Status: DC | PRN
  Administered 2016-04-14: 1 mL via OPHTHALMIC

## 2016-04-14 MED ORDER — POVIDONE-IODINE 5 % OP SOLN
1.0000 "application " | Freq: Once | OPHTHALMIC | Status: AC
  Administered 2016-04-14: 1 via OPHTHALMIC

## 2016-04-14 MED ORDER — EPINEPHRINE HCL 1 MG/ML IJ SOLN
INTRAMUSCULAR | Status: AC
  Filled 2016-04-14: qty 2

## 2016-04-14 MED ORDER — LIDOCAINE HCL (PF) 4 % IJ SOLN
INTRAOCULAR | Status: DC | PRN
  Administered 2016-04-14: 4 mL via OPHTHALMIC

## 2016-04-14 MED ORDER — MOXIFLOXACIN HCL 0.5 % OP SOLN
OPHTHALMIC | Status: DC | PRN
  Administered 2016-04-14: 1 [drp] via OPHTHALMIC

## 2016-04-14 MED ORDER — SODIUM HYALURONATE 10 MG/ML IO SOLN
INTRAOCULAR | Status: DC | PRN
  Administered 2016-04-14: 0.85 mL via INTRAOCULAR

## 2016-04-14 MED ORDER — PHENYLEPHRINE HCL 10 % OP SOLN
1.0000 [drp] | OPHTHALMIC | Status: AC
  Administered 2016-04-14 (×3): 1 [drp] via OPHTHALMIC

## 2016-04-14 MED ORDER — MIDAZOLAM HCL 2 MG/2ML IJ SOLN
INTRAMUSCULAR | Status: DC | PRN
  Administered 2016-04-14 (×2): 1 mg via INTRAVENOUS

## 2016-04-14 MED ORDER — POVIDONE-IODINE 5 % OP SOLN
OPHTHALMIC | Status: AC
  Filled 2016-04-14: qty 30

## 2016-04-14 MED ORDER — CYCLOPENTOLATE HCL 2 % OP SOLN
1.0000 [drp] | OPHTHALMIC | Status: AC
  Administered 2016-04-14 (×2): 1 [drp] via OPHTHALMIC

## 2016-04-14 MED ORDER — SODIUM HYALURONATE 10 MG/ML IO SOLN
INTRAOCULAR | Status: AC
  Filled 2016-04-14: qty 0.85

## 2016-04-14 MED ORDER — PHENYLEPHRINE HCL 10 % OP SOLN
OPHTHALMIC | Status: AC
  Filled 2016-04-14: qty 5

## 2016-04-14 SURGICAL SUPPLY — 22 items
CANNULA ANT/CHMB 27GA (MISCELLANEOUS) ×6 IMPLANT
CUP MEDICINE 2OZ PLAST GRAD ST (MISCELLANEOUS) ×3 IMPLANT
GLOVE BIO SURGEON STRL SZ8 (GLOVE) ×3 IMPLANT
GLOVE BIOGEL M 6.5 STRL (GLOVE) ×3 IMPLANT
GLOVE SURG LX 7.5 STRW (GLOVE) ×2
GLOVE SURG LX STRL 7.5 STRW (GLOVE) ×1 IMPLANT
GOWN STRL REUS W/ TWL LRG LVL3 (GOWN DISPOSABLE) ×2 IMPLANT
GOWN STRL REUS W/TWL LRG LVL3 (GOWN DISPOSABLE) ×4
LENS IOL ACRSF IQ PC 15.0 (Intraocular Lens) ×1 IMPLANT
LENS IOL ACRYSOF IQ POST 15.0 (Intraocular Lens) ×3 IMPLANT
PACK CATARACT (MISCELLANEOUS) ×3 IMPLANT
PACK CATARACT BRASINGTON LX (MISCELLANEOUS) ×3 IMPLANT
PACK EYE AFTER SURG (MISCELLANEOUS) ×3 IMPLANT
SOL BSS BAG (MISCELLANEOUS) ×3
SOL PREP PVP 2OZ (MISCELLANEOUS) ×3
SOLUTION BSS BAG (MISCELLANEOUS) ×1 IMPLANT
SOLUTION PREP PVP 2OZ (MISCELLANEOUS) ×1 IMPLANT
SYR 3ML LL SCALE MARK (SYRINGE) ×6 IMPLANT
SYR 5ML LL (SYRINGE) ×3 IMPLANT
SYR TB 1ML 27GX1/2 LL (SYRINGE) ×3 IMPLANT
WATER STERILE IRR 250ML POUR (IV SOLUTION) ×3 IMPLANT
WIPE NON LINTING 3.25X3.25 (MISCELLANEOUS) ×3 IMPLANT

## 2016-04-14 NOTE — Discharge Instructions (Signed)
Eye Surgery Discharge Instructions ? ? ? ?Expect mild scratchy sensation or mild soreness. ?DO NOT RUB YOUR EYE! ? ?The day of surgery: ?Minimal physical activity, but bed rest is not required ?No reading, computer work, or close hand work ?No bending, lifting, or straining. ?May watch TV ? ?For 24 hours: ?No driving, legal decisions, or alcoholic beverages ?Safety precautions ?Eat anything you prefer: It is better to start with liquids, then soup then solid foods. ?_____ Eye patch should be worn until postoperative exam tomorrow. ?__x__ Solar shield eyeglasses should be worn for comfort in the sunlight/patch while sleeping ? ?Resume all regular medications including aspirin or Coumadin if these were discontinued prior to surgery. ?You may shower, bathe, shave, or wash your hair. ?Tylenol may be taken for mild discomfort. ? ?Call your doctor if you experience significant pain, nausea, or vomiting, fever > 101 or other signs of infection. [phone] or [phone] ?Specific instructions: ? ?  ?

## 2016-04-14 NOTE — Anesthesia Preprocedure Evaluation (Addendum)
Anesthesia Evaluation  Patient identified by MRN, date of birth, ID band Patient awake    Reviewed: Allergy & Precautions, NPO status , Patient's Chart, lab work & pertinent test results  History of Anesthesia Complications Negative for: history of anesthetic complications  Airway Mallampati: II       Dental  (+) Missing, Caps   Pulmonary shortness of breath and with exertion, COPD,  COPD inhaler, Current Smoker,    Pulmonary exam normal        Cardiovascular hypertension, Pt. on medications Normal cardiovascular exam     Neuro/Psych negative neurological ROS  negative psych ROS   GI/Hepatic negative GI ROS, Neg liver ROS,   Endo/Other  negative endocrine ROS  Renal/GU Renal InsufficiencyRenal disease (stones)  negative genitourinary   Musculoskeletal   Abdominal Normal abdominal exam  (+)   Peds negative pediatric ROS (+)  Hematology negative hematology ROS (+)   Anesthesia Other Findings   Reproductive/Obstetrics                            Anesthesia Physical  Anesthesia Plan  ASA: III  Anesthesia Plan: MAC   Post-op Pain Management:    Induction: Intravenous  Airway Management Planned: Nasal Cannula  Additional Equipment:   Intra-op Plan:   Post-operative Plan:   Informed Consent: I have reviewed the patients History and Physical, chart, labs and discussed the procedure including the risks, benefits and alternatives for the proposed anesthesia with the patient or authorized representative who has indicated his/her understanding and acceptance.     Plan Discussed with:   Anesthesia Plan Comments:         Anesthesia Quick Evaluation

## 2016-04-14 NOTE — H&P (Signed)
The History and Physical notes are on paper, have been signed, and are to be scanned. The patient remains stable and unchanged from the H&P.   Previous H&P reviewed, patient examined, and there are no changes.  Manuel BladeBradley Bowen 04/14/2016 7:25 AM

## 2016-04-14 NOTE — OR Nursing (Signed)
Dr. Brooke DareKing into see pt and Darel HongJudy.

## 2016-04-14 NOTE — Op Note (Signed)
OPERATIVE NOTE  Manuel KicksBernard D Tan Jr. [id number] 04/14/2016   PREOPERATIVE DIAGNOSIS:  Nuclear sclerotic cataract left eye.  H25.12   POSTOPERATIVE DIAGNOSIS:    Nuclear sclerotic cataract left eye.     PROCEDURE:  Phacoemusification with posterior chamber intraocular lens placement of the left eye   LENS:   Implant Name Type Inv. Item Serial No. Manufacturer Lot No. LRB No. Used  LENS IOL SN60WF 15.0 - [id number] Intraocular Lens LENS IOL SN60WF 15.0 [id number] ALCON   Left 1       SN60WF 15.0   ULTRASOUND TIME: 1 minutes 27 seconds.  CDE 9.48   SURGEON:  [name], MD, [name]   ANESTHESIA:  Topical with tetracaine drops and 2% Xylocaine jelly, augmented with 1% preservative-free intracameral lidocaine.   COMPLICATIONS:  None.   DESCRIPTION OF PROCEDURE:  The patient was identified in the holding room and transported to the operating room and placed in the supine position under the operating microscope.  The left eye was identified as the operative eye and it was prepped and draped in the usual sterile ophthalmic fashion.   A 1.0 millimeter clear-corneal paracentesis was made at the 5:00 position. 0.5 ml of preservative-free 1% lidocaine with epinephrine was injected into the anterior chamber.  The anterior chamber was filled with Healon 5 viscoelastic.  A 2.4 millimeter keratome was used to make a near-clear corneal incision at the 2:00 position.  A curvilinear capsulorrhexis was made with a cystotome and capsulorrhexis forceps.  Balanced salt solution was used to hydrodissect and hydrodelineate the nucleus.   Phacoemulsification was then used in stop and chop fashion to remove the lens nucleus and epinucleus.  The remaining cortex was then removed using the irrigation and aspiration handpiece. Healon was then placed into the capsular bag to distend it for lens placement.  A lens was then injected into the capsular bag.  The remaining viscoelastic was aspirated.   Wounds were  hydrated with balanced salt solution.  The anterior chamber was inflated to a physiologic pressure with balanced salt solution.  Intracameral vigamox 0.1 mL undiltued was injected into the eye.  No wound leaks were noted.  Topical Vigamox drops were applied to the eye.  The patient was taken to the recovery room in stable condition without complications of anesthesia or surgery  [name] 04/14/2016, 8:03 AM

## 2016-04-14 NOTE — Transfer of Care (Incomplete)
Immediate Anesthesia Transfer of Care Note  Patient: Manuel KicksBernard D Pennella Jr.  Procedure(s) Performed: Procedure(s) with comments: CATARACT EXTRACTION PHACO AND INTRAOCULAR LENS PLACEMENT (IOC) (Left) - US 1.27 AP% 10.8 CDE 9.48 Fluid Pack lot # [id number]  Patient Location: {PLACES; ANE POST:19477::"PACU"}  Anesthesia Type:{PROCEDURES; ANE POST ANESTHESIA TYPE:19480}  Level of Consciousness: {FINDINGS; ANE POST LEVEL OF CONSCIOUSNESS:19484}  Airway & Oxygen Therapy: {Exam; oxygen device:30095}  Post-op Assessment: {ASSESSMENT;POST-OP AVWUJW:11914}REPORT:18741}  Post vital signs: {DESC; ANE POST NWGNFA:21308}VITALS:19483}  Last Vitals:  Vitals:   04/14/16 0610  BP: 121/75  Pulse: 86  Resp: 14  Temp: 36.4 C    Last Pain:  Vitals:   04/14/16 0610  TempSrc: Tympanic         Complications: {FINDINGS; ANE POST COMPLICATIONS:19485}

## 2016-04-14 NOTE — Anesthesia Postprocedure Evaluation (Signed)
Anesthesia Post Note  Patient: Manuel KicksBernard D Gesner Jr.  Procedure(s) Performed: Procedure(s) (LRB): CATARACT EXTRACTION PHACO AND INTRAOCULAR LENS PLACEMENT (IOC) (Left)  Patient location during evaluation: PACU Anesthesia Type: MAC Level of consciousness: awake, awake and alert and oriented Pain management: pain level controlled Vital Signs Assessment: post-procedure vital signs reviewed and stable Respiratory status: spontaneous breathing Cardiovascular status: blood pressure returned to baseline and stable Postop Assessment: no headache and no backache Anesthetic complications: no    Last Vitals:  Vitals:   04/14/16 0610  BP: 121/75  Pulse: 86  Resp: 14  Temp: 36.4 C    Last Pain:  Vitals:   04/14/16 0610  TempSrc: Tympanic                 Amir Fick C

## 2016-04-14 NOTE — Transfer of Care (Signed)
Immediate Anesthesia Transfer of Care Note  Patient: Cecilie KicksBernard D Kilmartin Jr.  Procedure(s) Performed: Procedure(s) with comments: CATARACT EXTRACTION PHACO AND INTRAOCULAR LENS PLACEMENT (IOC) (Left) - US 1.27 AP% 10.8 CDE 9.48 Fluid Pack lot # [id number] H  Patient Location: PACU  Anesthesia Type:MAC  Level of Consciousness: awake, alert  and oriented  Airway & Oxygen Therapy: Patient Spontanous Breathing  Post-op Assessment: Report given to RN and Post -op Vital signs reviewed and stable  Post vital signs: Reviewed and stable  Last Vitals:  Vitals:   04/14/16 0610  BP: 121/75  Pulse: 86  Resp: 14  Temp: 36.4 C    Last Pain:  Vitals:   04/14/16 0610  TempSrc: Tympanic         Complications: No apparent anesthesia complications

## 2016-06-28 DIAGNOSIS — N529 Male erectile dysfunction, unspecified: Secondary | ICD-10-CM | POA: Diagnosis not present

## 2016-06-28 DIAGNOSIS — I10 Essential (primary) hypertension: Secondary | ICD-10-CM | POA: Diagnosis not present

## 2016-06-28 DIAGNOSIS — F17200 Nicotine dependence, unspecified, uncomplicated: Secondary | ICD-10-CM | POA: Diagnosis not present

## 2016-12-30 DIAGNOSIS — K4090 Unilateral inguinal hernia, without obstruction or gangrene, not specified as recurrent: Secondary | ICD-10-CM | POA: Diagnosis not present

## 2016-12-30 DIAGNOSIS — I10 Essential (primary) hypertension: Secondary | ICD-10-CM | POA: Diagnosis not present

## 2016-12-30 DIAGNOSIS — J449 Chronic obstructive pulmonary disease, unspecified: Secondary | ICD-10-CM | POA: Diagnosis not present

## 2017-05-19 DIAGNOSIS — Z23 Encounter for immunization: Secondary | ICD-10-CM | POA: Diagnosis not present

## 2017-06-21 DIAGNOSIS — M9905 Segmental and somatic dysfunction of pelvic region: Secondary | ICD-10-CM | POA: Diagnosis not present

## 2017-06-21 DIAGNOSIS — M9902 Segmental and somatic dysfunction of thoracic region: Secondary | ICD-10-CM | POA: Diagnosis not present

## 2017-06-21 DIAGNOSIS — M5136 Other intervertebral disc degeneration, lumbar region: Secondary | ICD-10-CM | POA: Diagnosis not present

## 2017-06-21 DIAGNOSIS — M955 Acquired deformity of pelvis: Secondary | ICD-10-CM | POA: Diagnosis not present

## 2017-06-21 DIAGNOSIS — M9903 Segmental and somatic dysfunction of lumbar region: Secondary | ICD-10-CM | POA: Diagnosis not present

## 2017-06-21 DIAGNOSIS — M546 Pain in thoracic spine: Secondary | ICD-10-CM | POA: Diagnosis not present

## 2017-07-05 DIAGNOSIS — Z136 Encounter for screening for cardiovascular disorders: Secondary | ICD-10-CM | POA: Diagnosis not present

## 2017-07-05 DIAGNOSIS — Z125 Encounter for screening for malignant neoplasm of prostate: Secondary | ICD-10-CM | POA: Diagnosis not present

## 2017-07-05 DIAGNOSIS — K4090 Unilateral inguinal hernia, without obstruction or gangrene, not specified as recurrent: Secondary | ICD-10-CM | POA: Diagnosis not present

## 2017-07-05 DIAGNOSIS — I10 Essential (primary) hypertension: Secondary | ICD-10-CM | POA: Diagnosis not present

## 2017-07-05 DIAGNOSIS — J449 Chronic obstructive pulmonary disease, unspecified: Secondary | ICD-10-CM | POA: Diagnosis not present

## 2017-07-05 DIAGNOSIS — Z0000 Encounter for general adult medical examination without abnormal findings: Secondary | ICD-10-CM | POA: Diagnosis not present

## 2017-07-07 ENCOUNTER — Other Ambulatory Visit: Payer: Self-pay | Admitting: Family Medicine

## 2017-08-02 DIAGNOSIS — M9902 Segmental and somatic dysfunction of thoracic region: Secondary | ICD-10-CM | POA: Diagnosis not present

## 2017-08-02 DIAGNOSIS — M9905 Segmental and somatic dysfunction of pelvic region: Secondary | ICD-10-CM | POA: Diagnosis not present

## 2017-08-02 DIAGNOSIS — M546 Pain in thoracic spine: Secondary | ICD-10-CM | POA: Diagnosis not present

## 2017-08-02 DIAGNOSIS — M5136 Other intervertebral disc degeneration, lumbar region: Secondary | ICD-10-CM | POA: Diagnosis not present

## 2017-08-02 DIAGNOSIS — M9903 Segmental and somatic dysfunction of lumbar region: Secondary | ICD-10-CM | POA: Diagnosis not present

## 2017-08-02 DIAGNOSIS — M955 Acquired deformity of pelvis: Secondary | ICD-10-CM | POA: Diagnosis not present

## 2017-09-01 DIAGNOSIS — H26491 Other secondary cataract, right eye: Secondary | ICD-10-CM | POA: Diagnosis not present

## 2017-09-20 DIAGNOSIS — M9905 Segmental and somatic dysfunction of pelvic region: Secondary | ICD-10-CM | POA: Diagnosis not present

## 2017-09-20 DIAGNOSIS — M546 Pain in thoracic spine: Secondary | ICD-10-CM | POA: Diagnosis not present

## 2017-09-20 DIAGNOSIS — M9902 Segmental and somatic dysfunction of thoracic region: Secondary | ICD-10-CM | POA: Diagnosis not present

## 2017-09-20 DIAGNOSIS — M9903 Segmental and somatic dysfunction of lumbar region: Secondary | ICD-10-CM | POA: Diagnosis not present

## 2017-09-20 DIAGNOSIS — M5136 Other intervertebral disc degeneration, lumbar region: Secondary | ICD-10-CM | POA: Diagnosis not present

## 2017-09-20 DIAGNOSIS — M955 Acquired deformity of pelvis: Secondary | ICD-10-CM | POA: Diagnosis not present

## 2017-10-23 DIAGNOSIS — R05 Cough: Secondary | ICD-10-CM | POA: Diagnosis not present

## 2017-10-23 DIAGNOSIS — J159 Unspecified bacterial pneumonia: Secondary | ICD-10-CM | POA: Diagnosis not present

## 2017-10-23 DIAGNOSIS — R0602 Shortness of breath: Secondary | ICD-10-CM | POA: Diagnosis not present

## 2017-11-24 DIAGNOSIS — J189 Pneumonia, unspecified organism: Secondary | ICD-10-CM | POA: Diagnosis not present

## 2018-01-04 DIAGNOSIS — I10 Essential (primary) hypertension: Secondary | ICD-10-CM | POA: Diagnosis not present

## 2018-01-04 DIAGNOSIS — J449 Chronic obstructive pulmonary disease, unspecified: Secondary | ICD-10-CM | POA: Diagnosis not present

## 2018-01-04 DIAGNOSIS — K4090 Unilateral inguinal hernia, without obstruction or gangrene, not specified as recurrent: Secondary | ICD-10-CM | POA: Diagnosis not present

## 2018-01-17 ENCOUNTER — Ambulatory Visit (INDEPENDENT_AMBULATORY_CARE_PROVIDER_SITE_OTHER): Payer: PPO | Admitting: Surgery

## 2018-01-17 ENCOUNTER — Encounter: Payer: Self-pay | Admitting: Surgery

## 2018-01-17 VITALS — BP 119/71 | HR 97 | Temp 97.8°F | Ht 70.0 in | Wt 166.0 lb

## 2018-01-17 DIAGNOSIS — K4020 Bilateral inguinal hernia, without obstruction or gangrene, not specified as recurrent: Secondary | ICD-10-CM

## 2018-01-17 NOTE — Patient Instructions (Signed)
Hernia A hernia happens when an organ or tissue inside your body pushes out through a weak spot in the belly (abdomen). Follow these instructions at home:  Avoid stretching or overusing (straining) the muscles near the hernia.  Do not lift anything heavier than 10 lb (4.5 kg).  Use the muscles in your leg when you lift something up. Do not use the muscles in your back.  When you cough, try to cough gently.  Eat a diet that has a lot of fiber. Eat lots of fruits and vegetables.  Drink enough fluids to keep your pee (urine) clear or pale yellow. Try to drink 6-8 glasses of water a day.  Take medicines to make your poop soft (stool softeners) as told by your doctor.  Lose weight, if you are overweight.  Do not use any tobacco products, including cigarettes, chewing tobacco, or electronic cigarettes. If you need help quitting, ask your doctor.  Keep all follow-up visits as told by your doctor. This is important. Contact a doctor if:  The skin by the hernia gets puffy (swollen) or red.  The hernia is painful. Get help right away if:  You have a fever.  You have belly pain that is getting worse.  You feel sick to your stomach (nauseous) or you throw up (vomit).  You cannot push the hernia back in place by gently pressing on it while you are lying down.  The hernia: ? Changes in shape or size. ? Is stuck outside your belly. ? Changes color. ? Feels hard or tender. This information is not intended to replace advice given to you by your health care provider. Make sure you discuss any questions you have with your health care provider. Document Released: 12/22/2009 Document Revised: 12/10/2015 Document Reviewed: 05/14/2014 Else[employer] Interactive Patient Education  2018 [employer]  

## 2018-01-17 NOTE — Progress Notes (Signed)
Surgical Clinic History and Physical  Referring provider:  Sula Rumple, MD No address on file  HISTORY OF PRESENT ILLNESS (HPI):  72 y.o. male presents for evaluation of his Left inguinal hernia. Patient reports it has been present for years, has not change size significantly, and does not cause him significant pain or discomfort. He also reports that it reduces spontaneously without any abdominal pain or distention. Though patient has COPD with cough, recent pneumonia, and continues to smoke, he denies any constipation or straining with urination. Patient otherwise denies fever/chills, CP, or SOB.  PAST MEDICAL HISTORY (PMH):  Past Medical History:  Diagnosis Date  . Chronic kidney disease    stones  . COPD (chronic obstructive pulmonary disease) (HCC)   . Hypertension   . Shortness of breath dyspnea      PAST SURGICAL HISTORY (PSH):  Past Surgical History:  Procedure Laterality Date  . CATARACT EXTRACTION W/PHACO Right 06/25/2015   Procedure: CATARACT EXTRACTION PHACO AND INTRAOCULAR LENS PLACEMENT (IOC);  Surgeon: Lia Hopping, MD;  Location: [facility] ORS;  Service: Ophthalmology;  Laterality: Right;  [place]: 01:23.0 AP%: 13.0 CDE: 10.75 Lot # [id number]  . CATARACT EXTRACTION W/PHACO Left 04/14/2016   Procedure: CATARACT EXTRACTION PHACO AND INTRAOCULAR LENS PLACEMENT (IOC);  Surgeon: Nevada Crane, MD;  Location: [facility];  Service: Ophthalmology;  Laterality: Left;  [place] 1.27 AP% 10.8 CDE 9.48 Fluid Pack lot # [id number]  . COLONOSCOPY    . EYE SURGERY    . GAS/FLUID EXCHANGE Right 02/11/2015   Procedure: GAS/FLUID EXCHANGE;  Surgeon: Marcelene Butte, MD;  Location: [facility] OR[facility];  Service: Ophthalmology;  Laterality: Right;  24%  SF6  . LITHOTRIPSY    . MEMBRANE PEEL Right 02/11/2015   Procedure: MEMBRANE PEEL;  Surgeon: Marcelene Butte, MD;  Location: [facility] OR[facility];  Service: Ophthalmology;  Laterality: Right;  . PARS PLANA VITRECTOMY W/ SCLERAL BUCKLE Right 02/11/2015    Procedure: PARS PLANA VITRECTOMY WITH LASER FOR MACULAR HOLE;  Surgeon: Marcelene Butte, MD;  Location: [facility] OR[facility];  Service: Ophthalmology;  Laterality: Right;  Power200 Duration200 Interval200 Shot count117 Total energy 4.61 joules     MEDICATIONS:  Prior to Admission medications   Medication Sig Start Date End Date Taking? Authorizing Provider  amLODipine (NORVASC) 10 MG tablet Take 10 mg by mouth daily.   Yes [provider]  aspirin 81 MG tablet Take 81 mg by mouth daily.   Yes [provider]  Multiple Vitamin (MULTI-VITAMINS) TABS Take by mouth.   Yes [provider]  polyethylene glycol powder (GLYCOLAX/MIRALAX) powder Take as directed for colonoscopy prep. 06/09/14  Yes [provider]     ALLERGIES:  No Known Allergies   SOCIAL HISTORY:  Social History   Socioeconomic History  . Marital status: Widowed    Spouse name: Not on file  . Number of children: Not on file  . Years of education: Not on file  . Highest education level: Not on file  Occupational History  . Not on file  Social Needs  . Financial resource strain: Not on file  . Food insecurity:    Worry: Not on file    Inability: Not on file  . Transportation needs:    Medical: Not on file    Non-medical: Not on file  Tobacco Use  . Smoking status: Current Every Day Smoker  . Smokeless tobacco: Current User  Substance and Sexual Activity  . Alcohol use: Yes  . Drug use: Never  . Sexual activity: Not on file  Lifestyle  . Physical activity:    Days per week: Not on file    Minutes per session: Not on file  . Stress: Not on file  Relationships  . Social connections:    Talks on phone: Not on file    Gets together: Not on file    Attends religious service: Not on file    Active member of club or organization: Not on file    Attends meetings of clubs or organizations: Not on file    Relationship status: Not on file  . Intimate partner violence:    Fear of current  or ex partner: Not on file    Emotionally abused: Not on file    Physically abused: Not on file    Forced sexual activity: Not on file  Other Topics Concern  . Not on file  Social History Narrative  . Not on file    The patient currently resides (home / rehab facility / nursing home): Home The patient normally is (ambulatory / bedbound): Ambulatory  FAMILY HISTORY:  No family history on file.  Otherwise negative/non-contributory.  REVIEW OF SYSTEMS:  Constitutional: denies any other weight loss, fever, chills, or sweats  Eyes: denies any other vision changes, history of eye injury  ENT: denies sore throat, hearing problems  Respiratory: denies shortness of breath, wheezing  Cardiovascular: denies chest pain, palpitations  Gastrointestinal: denies abdominal pain, N/V, and bowel function as per HPI Musculoskeletal: denies any other joint pains or cramps  Skin: Denies any other rashes or skin discolorations Neurological: denies any other headache, dizziness, weakness  Psychiatric: Denies any other depression, anxiety   All other review of systems were otherwise negative   VITAL SIGNS:  BP 119/71   Pulse 97   Temp 97.8 F (36.6 C) (Oral)   Ht 5\' 10"  (1.778 m)   Wt 166 lb (75.3 kg)   BMI 23.82 kg/m    PHYSICAL EXAM:  Constitutional:  -- Normal body habitus  -- Awake, alert, and oriented x3  Eyes:  -- Pupils equally round and reactive to light  -- No scleral icterus  Ear, nose, throat:  -- No jugular venous distension -- No nasal drainage, bleeding Pulmonary:  -- No crackles  -- Equal breath sounds bilaterally -- Breathing non-labored at rest Cardiovascular:  -- S1, S2 present  -- No pericardial rubs  Gastrointestinal:  -- Abdomen soft, nontender, non-distended, no guarding/rebound -- Small non-tender easily reducible Right inguinal hernia, no Left inguinal hernia able to be appreciated at time of exam, though there does appear to be some mild inguinal canal  tenderness to palpation -- No abdominal masses appreciated, pulsatile or otherwise  Musculoskeletal and Integumentary:  -- Wounds or skin discoloration: None appreciated -- Extremities: B/L UE and LE FROM, hands and feet warm, no edema  Neurologic:  -- Motor function: Intact and symmetric -- Sensation: Intact and symmetric  Labs: CBC: No results found for: WBC, RBC BMP: No results found for: GLUCOSE, POTASSIUM, CHLORIDE, CO2, BUN, CREATININE, CALCIUM   Imaging studies: No new pertinent imaging studies available for review   Assessment/Plan:  72 y.o. male with an asymptomatic small reducible Right inguinal hernia and possible small minimally symptomatic Left inguinal hernia not appreciated on exam today, complicated by co-morbidities including HTN, COPD with recent pneumonia, CKD, and chronic ongoing tobacco abuse (smoking).   - signs and symptoms of hernia incarceration and obstruction discussed   - strategies reviewed for manual self-reduction of inguinal hernia(s) as needed   - all  risks, benefits, and alternatives to elective repair of inguinal hernias were discussed with the patient, all of his questions were answered to his expressed satisfaction, patient expresses he is not currently interested in surgery   - discussed importance of hydration and fiber to reduce constipation  - return to clinic as needed, instructed to call if any questions or concerns  - smoking cessation strongly encouraged and discussed  All of the above recommendations were discussed with the patient, and all of patient's questions were answered to his expressed satisfaction.  Thank you for the opportunity to participate in this patient's care.  -- Scherrie GerlachJason E. Earlene Plateravis, MD, RPVI [place]: [facility] General Surgery - Partnering for exceptional care. Office: [phone]

## 2018-02-15 ENCOUNTER — Encounter: Payer: Self-pay | Admitting: Surgery

## 2018-06-27 ENCOUNTER — Other Ambulatory Visit: Payer: Self-pay | Admitting: Family Medicine

## 2018-06-27 DIAGNOSIS — K4020 Bilateral inguinal hernia, without obstruction or gangrene, not specified as recurrent: Secondary | ICD-10-CM | POA: Diagnosis not present

## 2018-06-27 DIAGNOSIS — Z125 Encounter for screening for malignant neoplasm of prostate: Secondary | ICD-10-CM | POA: Diagnosis not present

## 2018-06-27 DIAGNOSIS — I10 Essential (primary) hypertension: Secondary | ICD-10-CM | POA: Diagnosis not present

## 2018-06-27 DIAGNOSIS — Z136 Encounter for screening for cardiovascular disorders: Secondary | ICD-10-CM | POA: Diagnosis not present

## 2018-06-27 DIAGNOSIS — J449 Chronic obstructive pulmonary disease, unspecified: Secondary | ICD-10-CM | POA: Diagnosis not present

## 2018-06-29 ENCOUNTER — Ambulatory Visit
Admission: RE | Admit: 2018-06-29 | Discharge: 2018-06-29 | Disposition: A | Discharge status: Home / Self Care | Payer: PPO | Source: Ambulatory Visit | Attending: Family Medicine | Admitting: Family Medicine

## 2018-06-29 DIAGNOSIS — Z136 Encounter for screening for cardiovascular disorders: Secondary | ICD-10-CM | POA: Diagnosis not present

## 2018-06-29 DIAGNOSIS — Z87891 Personal history of nicotine dependence: Secondary | ICD-10-CM | POA: Diagnosis not present

## 2018-06-29 DIAGNOSIS — I700 Atherosclerosis of aorta: Secondary | ICD-10-CM | POA: Insufficient documentation

## 2018-12-27 DIAGNOSIS — Z0000 Encounter for general adult medical examination without abnormal findings: Secondary | ICD-10-CM | POA: Diagnosis not present

## 2018-12-27 DIAGNOSIS — I10 Essential (primary) hypertension: Secondary | ICD-10-CM | POA: Diagnosis not present

## 2018-12-27 DIAGNOSIS — K4020 Bilateral inguinal hernia, without obstruction or gangrene, not specified as recurrent: Secondary | ICD-10-CM | POA: Diagnosis not present

## 2018-12-27 DIAGNOSIS — I700 Atherosclerosis of aorta: Secondary | ICD-10-CM | POA: Diagnosis not present

## 2018-12-27 DIAGNOSIS — J449 Chronic obstructive pulmonary disease, unspecified: Secondary | ICD-10-CM | POA: Diagnosis not present

## 2019-07-05 DIAGNOSIS — K4020 Bilateral inguinal hernia, without obstruction or gangrene, not specified as recurrent: Secondary | ICD-10-CM | POA: Diagnosis not present

## 2019-07-05 DIAGNOSIS — J449 Chronic obstructive pulmonary disease, unspecified: Secondary | ICD-10-CM | POA: Diagnosis not present

## 2019-07-05 DIAGNOSIS — I10 Essential (primary) hypertension: Secondary | ICD-10-CM | POA: Diagnosis not present

## 2019-07-05 DIAGNOSIS — Z125 Encounter for screening for malignant neoplasm of prostate: Secondary | ICD-10-CM | POA: Diagnosis not present

## 2019-07-05 DIAGNOSIS — F17200 Nicotine dependence, unspecified, uncomplicated: Secondary | ICD-10-CM | POA: Diagnosis not present

## 2019-07-05 DIAGNOSIS — I700 Atherosclerosis of aorta: Secondary | ICD-10-CM | POA: Diagnosis not present

## 2019-08-16 DIAGNOSIS — R748 Abnormal levels of other serum enzymes: Secondary | ICD-10-CM | POA: Diagnosis not present

## 2019-09-01 ENCOUNTER — Ambulatory Visit: Payer: PPO | Attending: Internal Medicine

## 2019-09-01 DIAGNOSIS — Z23 Encounter for immunization: Secondary | ICD-10-CM | POA: Insufficient documentation

## 2019-09-01 NOTE — Progress Notes (Signed)
   [id number]  Name:  Manuel Bowen.    MRN: [id number] DOB: 1946-01-10  09/01/2019  Manuel Bowen was observed post Covid-19 immunization for 15 minutes without incidence. He was provided with Vaccine Information Sheet and instruction to access the V-Safe system.   Manuel Bowen was instructed to call 911 with any severe reactions post vaccine: Marland Kitchen Difficulty breathing  . Swelling of your face and throat  . A fast heartbeat  . A bad rash all over your body  . Dizziness and weakness    Immunizations Administered    Name Date Dose VIS Date Route   Pfizer COVID-19 Vaccine 09/01/2019  8:44 AM 0.3 mL 06/28/2019 Intramuscular   Manufacturer: [employer], [employer]   Lot: [id number]   NDC: [address]-1

## 2019-09-04 DIAGNOSIS — R748 Abnormal levels of other serum enzymes: Secondary | ICD-10-CM | POA: Diagnosis not present

## 2019-09-25 ENCOUNTER — Ambulatory Visit: Payer: PPO | Attending: Internal Medicine

## 2019-09-25 DIAGNOSIS — Z23 Encounter for immunization: Secondary | ICD-10-CM | POA: Insufficient documentation

## 2019-09-25 NOTE — Progress Notes (Signed)
   [id number]  Name:  Manuel Bowen.    MRN: [id number] DOB: June 20, 1946  09/25/2019  [name] was observed post Covid-19 immunization for 15 minutes without incident. He was provided with Vaccine Information Sheet and instruction to access the V-Safe system.   [name] was instructed to call 911 with any severe reactions post vaccine: Marland Kitchen Difficulty breathing  . Swelling of face and throat  . A fast heartbeat  . A bad rash all over body  . Dizziness and weakness   Immunizations Administered    Name Date Dose VIS Date Route   Pfizer COVID-19 Vaccine 09/25/2019  1:05 PM 0.3 mL 06/28/2019 Intramuscular   Manufacturer: [employer], [employer]   Lot: [id number]   NDC: [address]-2

## 2020-01-15 DIAGNOSIS — K4020 Bilateral inguinal hernia, without obstruction or gangrene, not specified as recurrent: Secondary | ICD-10-CM | POA: Diagnosis not present

## 2020-01-15 DIAGNOSIS — Z0000 Encounter for general adult medical examination without abnormal findings: Secondary | ICD-10-CM | POA: Diagnosis not present

## 2020-01-15 DIAGNOSIS — I10 Essential (primary) hypertension: Secondary | ICD-10-CM | POA: Diagnosis not present

## 2020-01-15 DIAGNOSIS — J449 Chronic obstructive pulmonary disease, unspecified: Secondary | ICD-10-CM | POA: Diagnosis not present

## 2020-01-15 DIAGNOSIS — I700 Atherosclerosis of aorta: Secondary | ICD-10-CM | POA: Diagnosis not present

## 2020-01-15 DIAGNOSIS — F17200 Nicotine dependence, unspecified, uncomplicated: Secondary | ICD-10-CM | POA: Diagnosis not present

## 2020-01-15 DIAGNOSIS — Z1211 Encounter for screening for malignant neoplasm of colon: Secondary | ICD-10-CM | POA: Diagnosis not present

## 2020-01-15 DIAGNOSIS — Z87891 Personal history of nicotine dependence: Secondary | ICD-10-CM | POA: Diagnosis not present

## 2020-01-23 DIAGNOSIS — R7301 Impaired fasting glucose: Secondary | ICD-10-CM | POA: Diagnosis not present

## 2020-01-28 DIAGNOSIS — K4020 Bilateral inguinal hernia, without obstruction or gangrene, not specified as recurrent: Secondary | ICD-10-CM | POA: Diagnosis not present

## 2020-01-30 ENCOUNTER — Telehealth: Payer: Self-pay | Admitting: *Deleted

## 2020-01-30 NOTE — Telephone Encounter (Signed)
Received referral for low dose lung cancer screening CT scan. Message left at phone number listed in EMR for patient to call me back to facilitate scheduling scan.  

## 2020-01-31 ENCOUNTER — Encounter: Payer: Self-pay | Admitting: *Deleted

## 2020-01-31 ENCOUNTER — Telehealth: Payer: Self-pay | Admitting: *Deleted

## 2020-01-31 DIAGNOSIS — Z87891 Personal history of nicotine dependence: Secondary | ICD-10-CM

## 2020-01-31 DIAGNOSIS — Z122 Encounter for screening for malignant neoplasm of respiratory organs: Secondary | ICD-10-CM

## 2020-01-31 NOTE — Telephone Encounter (Signed)
Received referral for initial lung cancer screening scan. Contacted patient and obtained smoking history,(current, 65 pack year) as well as answering questions related to screening process. Patient denies signs of lung cancer such as weight loss or hemoptysis. Patient denies comorbidity that would prevent curative treatment if lung cancer were found. Patient is scheduled for shared decision making visit and CT scan on 02/11/20 at 145pm.

## 2020-02-11 ENCOUNTER — Ambulatory Visit
Admission: RE | Admit: 2020-02-11 | Discharge: 2020-02-11 | Disposition: A | Discharge status: Home / Self Care | Payer: PPO | Source: Ambulatory Visit | Attending: Nurse Practitioner | Admitting: Nurse Practitioner

## 2020-02-11 ENCOUNTER — Inpatient Hospital Stay: Payer: PPO | Attending: Nurse Practitioner | Admitting: Nurse Practitioner

## 2020-02-11 ENCOUNTER — Other Ambulatory Visit: Payer: Self-pay

## 2020-02-11 DIAGNOSIS — Z122 Encounter for screening for malignant neoplasm of respiratory organs: Secondary | ICD-10-CM | POA: Insufficient documentation

## 2020-02-11 DIAGNOSIS — Z87891 Personal history of nicotine dependence: Secondary | ICD-10-CM | POA: Diagnosis not present

## 2020-02-11 DIAGNOSIS — F17210 Nicotine dependence, cigarettes, uncomplicated: Secondary | ICD-10-CM | POA: Diagnosis not present

## 2020-02-11 NOTE — Progress Notes (Signed)
Virtual Visit via Video Enabled Telemedicine Note   I connected with Cy Blamer. on 02/11/20 at 2:00 PM EST by video enabled telemedicine visit and verified that I am speaking with the correct person using two identifiers.   I discussed the limitations, risks, security and privacy concerns of performing an evaluation and management service by telemedicine and the availability of in-person appointments. I also discussed with the patient that there may be a patient responsible charge related to this service. The patient expressed understanding and agreed to proceed.   Other persons participating in the visit and their role in the encounter: Burgess Estelle, RN- checking in patient & navigation  Patient's location: [place]  Provider's location: Clinic  Chief Complaint: Low Dose CT Screening  Patient agreed to evaluation by telemedicine to discuss shared decision making for consideration of low dose CT lung cancer screening.    In accordance with CMS guidelines, patient has met eligibility criteria including age, absence of signs or symptoms of lung cancer.  Social History   Tobacco Use  . Smoking status: Current Every Day Smoker    Packs/day: 1.25    Years: 52.00    Pack years: 65.00    Types: Cigarettes  . Smokeless tobacco: Current User  Substance Use Topics  . Alcohol use: Yes     A shared decision-making session was conducted prior to the performance of CT scan. This includes one or more decision aids, includes benefits and harms of screening, follow-up diagnostic testing, over-diagnosis, false positive rate, and total radiation exposure.   Counseling on the importance of adherence to annual lung cancer LDCT screening, impact of co-morbidities, and ability or willingness to undergo diagnosis and treatment is imperative for compliance of the program.   Counseling on the importance of continued smoking cessation for former smokers; the importance of smoking cessation  for current smokers, and information about tobacco cessation interventions have been given to patient including [place] and [employer] [place] programs.   Written order for lung cancer screening with LDCT has been given to the patient and any and all questions have been answered to the best of my abilities.    Yearly follow up will be coordinated by Burgess Estelle, Thoracic Navigator.  I discussed the assessment and treatment plan with the patient. The patient was provided an opportunity to ask questions and all were answered. The patient agreed with the plan and demonstrated an understanding of the instructions.   The patient was advised to call back or seek an in-person evaluation if the symptoms worsen or if the condition fails to improve as anticipated.   I provided 15 minutes of face-to-face video visit time during this encounter, and > 50% was spent counseling as documented under my assessment & plan.   Beckey Rutter, DNP, AGNP-C [place] at [facility] [phone] (clinic)

## 2020-02-14 ENCOUNTER — Telehealth: Payer: Self-pay | Admitting: *Deleted

## 2020-02-14 NOTE — Telephone Encounter (Signed)
Notified patient of LDCT lung cancer screening program results with recommendation for 6 month follow up imaging. Also notified of incidental findings noted below and is encouraged to discuss further with PCP who will receive a copy of this note and/or the CT report. Patient verbalizes understanding.   IMPRESSION: 1. Lung-RADS 3, probably benign findings. Short-term follow-up in 6 months is recommended with repeat low-dose chest CT without contrast (please use the following order, "CT CHEST LCS NODULE FOLLOW-UP W/O CM"). Bilateral pulmonary nodules, including a left upper lobe pulmonary nodule of volume derived equivalent diameter 7.6 mm. 2. Aortic Atherosclerosis (ICD10-I70.0) and Emphysema (ICD10-J43.9). Coronary artery atherosclerosis. 3. Left renal atrophy.

## 2020-04-01 DIAGNOSIS — Z86010 Personal history of colonic polyps: Secondary | ICD-10-CM | POA: Diagnosis not present

## 2020-04-23 ENCOUNTER — Other Ambulatory Visit: Admission: RE | Admit: 2020-04-23 | Payer: PPO | Source: Ambulatory Visit

## 2020-04-24 ENCOUNTER — Encounter: Payer: Self-pay | Admitting: *Deleted

## 2020-04-24 ENCOUNTER — Other Ambulatory Visit: Payer: Self-pay

## 2020-04-24 ENCOUNTER — Other Ambulatory Visit
Admission: RE | Admit: 2020-04-24 | Discharge: 2020-04-24 | Disposition: A | Discharge status: Home / Self Care | Payer: PPO | Source: Ambulatory Visit | Attending: Gastroenterology | Admitting: Gastroenterology

## 2020-04-24 DIAGNOSIS — Z01818 Encounter for other preprocedural examination: Secondary | ICD-10-CM | POA: Insufficient documentation

## 2020-04-24 DIAGNOSIS — Z20822 Contact with and (suspected) exposure to covid-19: Secondary | ICD-10-CM | POA: Insufficient documentation

## 2020-04-24 LAB — SARS CORONAVIRUS 2 (TAT 6-24 HRS): SARS Coronavirus 2: NEGATIVE

## 2020-04-27 ENCOUNTER — Other Ambulatory Visit: Payer: Self-pay

## 2020-04-27 ENCOUNTER — Ambulatory Visit: Payer: PPO | Admitting: Anesthesiology

## 2020-04-27 ENCOUNTER — Encounter
Admission: RE | Disposition: A | Discharge status: Home / Self Care | Payer: Self-pay | Source: Home / Self Care | Attending: Gastroenterology

## 2020-04-27 ENCOUNTER — Ambulatory Visit
Admission: RE | Admit: 2020-04-27 | Discharge: 2020-04-27 | Disposition: A | Discharge status: Home / Self Care | Payer: PPO | Attending: Gastroenterology | Admitting: Gastroenterology

## 2020-04-27 ENCOUNTER — Encounter: Payer: Self-pay | Admitting: Anesthesiology

## 2020-04-27 DIAGNOSIS — K640 First degree hemorrhoids: Secondary | ICD-10-CM | POA: Diagnosis not present

## 2020-04-27 DIAGNOSIS — I10 Essential (primary) hypertension: Secondary | ICD-10-CM | POA: Insufficient documentation

## 2020-04-27 DIAGNOSIS — Z800 Family history of malignant neoplasm of digestive organs: Secondary | ICD-10-CM | POA: Diagnosis not present

## 2020-04-27 DIAGNOSIS — Z79899 Other long term (current) drug therapy: Secondary | ICD-10-CM | POA: Insufficient documentation

## 2020-04-27 DIAGNOSIS — K5790 Diverticulosis of intestine, part unspecified, without perforation or abscess without bleeding: Secondary | ICD-10-CM | POA: Diagnosis not present

## 2020-04-27 DIAGNOSIS — Z9841 Cataract extraction status, right eye: Secondary | ICD-10-CM | POA: Insufficient documentation

## 2020-04-27 DIAGNOSIS — J449 Chronic obstructive pulmonary disease, unspecified: Secondary | ICD-10-CM | POA: Insufficient documentation

## 2020-04-27 DIAGNOSIS — Z7951 Long term (current) use of inhaled steroids: Secondary | ICD-10-CM | POA: Insufficient documentation

## 2020-04-27 DIAGNOSIS — K5730 Diverticulosis of large intestine without perforation or abscess without bleeding: Secondary | ICD-10-CM | POA: Insufficient documentation

## 2020-04-27 DIAGNOSIS — Z961 Presence of intraocular lens: Secondary | ICD-10-CM | POA: Insufficient documentation

## 2020-04-27 DIAGNOSIS — N189 Chronic kidney disease, unspecified: Secondary | ICD-10-CM | POA: Diagnosis not present

## 2020-04-27 DIAGNOSIS — Z9842 Cataract extraction status, left eye: Secondary | ICD-10-CM | POA: Diagnosis not present

## 2020-04-27 DIAGNOSIS — Z86010 Personal history of colonic polyps: Secondary | ICD-10-CM | POA: Diagnosis not present

## 2020-04-27 DIAGNOSIS — R0602 Shortness of breath: Secondary | ICD-10-CM | POA: Diagnosis not present

## 2020-04-27 DIAGNOSIS — Z87442 Personal history of urinary calculi: Secondary | ICD-10-CM | POA: Insufficient documentation

## 2020-04-27 DIAGNOSIS — Z1211 Encounter for screening for malignant neoplasm of colon: Secondary | ICD-10-CM | POA: Diagnosis not present

## 2020-04-27 DIAGNOSIS — I129 Hypertensive chronic kidney disease with stage 1 through stage 4 chronic kidney disease, or unspecified chronic kidney disease: Secondary | ICD-10-CM | POA: Insufficient documentation

## 2020-04-27 DIAGNOSIS — F17200 Nicotine dependence, unspecified, uncomplicated: Secondary | ICD-10-CM | POA: Diagnosis not present

## 2020-04-27 DIAGNOSIS — K648 Other hemorrhoids: Secondary | ICD-10-CM | POA: Diagnosis not present

## 2020-04-27 HISTORY — DX: Diverticulosis of intestine, part unspecified, without perforation or abscess without bleeding: K57.90

## 2020-04-27 HISTORY — DX: Personal history of urinary calculi: Z87.442

## 2020-04-27 HISTORY — PX: COLONOSCOPY WITH PROPOFOL: SHX5780

## 2020-04-27 HISTORY — DX: Unilateral inguinal hernia, without obstruction or gangrene, not specified as recurrent: K40.90

## 2020-04-27 SURGERY — COLONOSCOPY WITH PROPOFOL
Anesthesia: General

## 2020-04-27 SURGERY — COLONOSCOPY
Anesthesia: General

## 2020-04-27 MED ORDER — PROPOFOL 500 MG/50ML IV EMUL
INTRAVENOUS | Status: DC | PRN
  Administered 2020-04-27: 150 ug/kg/min via INTRAVENOUS

## 2020-04-27 MED ORDER — SODIUM CHLORIDE 0.9 % IV SOLN
INTRAVENOUS | Status: DC
  Administered 2020-04-27: 20 mL/h via INTRAVENOUS

## 2020-04-27 NOTE — H&P (Signed)
Outpatient short stay form Pre-procedure 04/27/2020 11:35 AM Merlyn Lot MD, [name]  Primary Physician: Dr. Maryjane Hurter  Reason for visit:  Surveillance  History of present illness:   74 y/o gentleman with history of polyps and reported history of colon cancer in his father in his 31's (although not confirmed) here for colonoscopy. No blood thinners and no abdominal surgeries.    Current Facility-Administered Medications:  .  0.9 %  sodium chloride infusion, , Intravenous, Continuous, Tiya Schrupp, Rossie Muskrat, MD, Last Rate: 20 mL/hr at 04/27/20 1052, 20 mL/hr at 04/27/20 1052  Medications Prior to Admission  Medication Sig Dispense Refill Last Dose  . albuterol (VENTOLIN HFA) 108 (90 Base) MCG/ACT inhaler Inhale 2 puffs into the lungs every 6 (six) hours as needed for wheezing or shortness of breath.   Past Week at Unknown time  . amLODipine (NORVASC) 10 MG tablet Take 10 mg by mouth daily.   04/27/2020 at 0600  . aspirin 81 MG tablet Take 81 mg by mouth daily.   Past Week at Unknown time  . Multiple Vitamin (MULTI-VITAMINS) TABS Take by mouth.   Past Week at Unknown time  . polyethylene glycol powder (GLYCOLAX/MIRALAX) powder Take as directed for colonoscopy prep.   04/26/2020 at Unknown time  . rosuvastatin (CRESTOR) 5 MG tablet Take 5 mg by mouth daily.   Past Month at Unknown time  . tiotropium (SPIRIVA) 18 MCG inhalation capsule Place 18 mcg into inhaler and inhale daily.   Past Week at Unknown time     No Known Allergies   Past Medical History:  Diagnosis Date  . Chronic kidney disease    stones  . COPD (chronic obstructive pulmonary disease) (HCC)   . Diverticulosis   . History of kidney stones   . Hypertension   . Inguinal hernia   . Shortness of breath dyspnea     Review of systems:  Otherwise negative.    Physical Exam  Gen: Alert, oriented. Appears stated age.  HEENT: [place]/AT. PERRLA. Lungs: No respiratory distress Abd: soft, benign, no masses. BS+ Ext: No  edema. Pulses 2+    Planned procedures: Proceed with colonoscopy. The patient understands the nature of the planned procedure, indications, risks, alternatives and potential complications including but not limited to bleeding, infection, perforation, damage to internal organs and possible oversedation/side effects from anesthesia. The patient agrees and gives consent to proceed.  Please refer to procedure notes for findings, recommendations and patient disposition/instructions.     Merlyn Lot MD, [name] Gastroenterology 04/27/2020  11:35 AM

## 2020-04-27 NOTE — Anesthesia Procedure Notes (Signed)
Performed by: Cook-Martin, Takai Chiaramonte Pre-anesthesia Checklist: Patient identified, Emergency Drugs available, Suction available, Patient being monitored and Timeout performed Patient Re-evaluated:Patient Re-evaluated prior to induction Oxygen Delivery Method: Nasal cannula Preoxygenation: Pre-oxygenation with 100% oxygen Induction Type: IV induction Placement Confirmation: positive ETCO2 and CO2 detector       

## 2020-04-27 NOTE — Op Note (Signed)
[facility] Gastroenterology Patient Name: Manuel Bowen Procedure Date: 04/27/2020 11:24 AM MRN: [id number] Account #: [id number] Date of Birth: July 14, 1946 Admit Type: Outpatient Age: 74 Room: [facility] ENDO ROOM 1 Gender: Male Note Status: Finalized Procedure:             Colonoscopy Indications:           High risk colon cancer surveillance: Personal history                         of colonic polyps, Family history of colon cancer Providers:             Andrey Farmer MD, MD Medicines:             Monitored Anesthesia Care Complications:         No immediate complications. Procedure:             Pre-Anesthesia Assessment:                        - Prior to the procedure, a History and Physical was                         performed, and patient medications and allergies were                         reviewed. The patient is competent. The risks and                         benefits of the procedure and the sedation options and                         risks were discussed with the patient. All questions                         were answered and informed consent was obtained.                         Patient identification and proposed procedure were                         verified by the physician, the nurse, the anesthetist                         and the technician in the endoscopy suite. Mental                         Status Examination: alert and oriented. Airway                         Examination: normal oropharyngeal airway and neck                         mobility. Respiratory Examination: clear to                         auscultation. CV Examination: normal. Prophylactic                         Antibiotics: The patient does not require prophylactic  antibiotics. Prior Anticoagulants: The patient has                         taken no previous anticoagulant or antiplatelet agents                         except for aspirin. ASA Grade  Assessment: III - A                         patient with severe systemic disease. After reviewing                         the risks and benefits, the patient was deemed in                         satisfactory condition to undergo the procedure. The                         anesthesia plan was to use monitored anesthesia care                         (MAC). Immediately prior to administration of                         medications, the patient was re-assessed for adequacy                         to receive sedatives. The heart rate, respiratory                         rate, oxygen saturations, blood pressure, adequacy of                         pulmonary ventilation, and response to care were                         monitored throughout the procedure. The physical                         status of the patient was re-assessed after the                         procedure.                        After obtaining informed consent, the colonoscope was                         passed under direct vision. Throughout the procedure,                         the patient's blood pressure, pulse, and oxygen                         saturations were monitored continuously. The                         Colonoscope was introduced through the anus and  advanced to the the cecum, identified by appendiceal                         orifice and ileocecal valve. The colonoscopy was                         performed without difficulty. The patient tolerated                         the procedure well. The quality of the bowel                         preparation was good. Findings:      The perianal and digital rectal examinations were normal.      Many small-mouthed diverticula were found in the sigmoid colon and       descending colon.      Non-bleeding internal hemorrhoids were found during retroflexion. The       hemorrhoids were Grade I (internal hemorrhoids that do not prolapse).      The  exam was otherwise without abnormality on direct and retroflexion       views. Impression:            - Diverticulosis in the sigmoid colon and in the                         descending colon.                        - Non-bleeding internal hemorrhoids.                        - The examination was otherwise normal on direct and                         retroflexion views.                        - No specimens collected. Recommendation:        - Discharge patient to home.                        - Resume previous diet.                        - Continue present medications.                        - Repeat colonoscopy in 5 years for surveillance if                         benefits outweigh risks.                        - Return to referring physician as previously                         scheduled. Procedure Code(s):     --- Professional ---                        O1308, Colorectal cancer screening; colonoscopy on  individual at high risk Diagnosis Code(s):     --- Professional ---                        Z86.010, Personal history of colonic polyps                        K64.0, First degree hemorrhoids                        Z80.0, Family history of malignant neoplasm of                         digestive organs                        K57.30, Diverticulosis of large intestine without                         perforation or abscess without bleeding CPT copyright 2019 American Medical Association. All rights reserved. The codes documented in this report are preliminary and upon coder review may  be revised to meet current compliance requirements. Andrey Farmer, MD Andrey Farmer MD, MD 04/27/2020 12:03:34 PM Number of Addenda: 0 Note Initiated On: 04/27/2020 11:24 AM Scope Withdrawal Time: 0 hours 9 minutes 49 seconds  Total Procedure Duration: 0 hours 14 minutes 34 seconds  Estimated Blood Loss:  Estimated blood loss: none.      [facility]

## 2020-04-27 NOTE — Anesthesia Preprocedure Evaluation (Signed)
Anesthesia Evaluation  Patient identified by MRN, date of birth, ID band Patient awake    Reviewed: Allergy & Precautions, H&P , NPO status , Patient's Chart, lab work & pertinent test results, reviewed documented beta blocker date and time   Airway Mallampati: II   Neck ROM: full    Dental  (+) Poor Dentition   Pulmonary shortness of breath, COPD, Current Smoker and Patient abstained from smoking.,    Pulmonary exam normal        Cardiovascular Exercise Tolerance: Good hypertension, On Medications negative cardio ROS Normal cardiovascular exam Rhythm:regular Rate:Normal     Neuro/Psych negative neurological ROS  negative psych ROS   GI/Hepatic negative GI ROS, Neg liver ROS,   Endo/Other  negative endocrine ROS  Renal/GU Renal disease  negative genitourinary   Musculoskeletal   Abdominal   Peds  Hematology negative hematology ROS (+)   Anesthesia Other Findings Past Medical History: No date: Chronic kidney disease     Comment:  stones No date: COPD (chronic obstructive pulmonary disease) (HCC) No date: Diverticulosis No date: History of kidney stones No date: Hypertension No date: Inguinal hernia No date: Shortness of breath dyspnea Past Surgical History: 06/25/2015: CATARACT EXTRACTION W/PHACO; Right     Comment:  Procedure: CATARACT EXTRACTION PHACO AND INTRAOCULAR               LENS PLACEMENT (IOC);  Surgeon: Lia Hopping, MD;                Location: [facility] ORS;  Service: Ophthalmology;  Laterality:              Right;  [place]: 01:23.0 AP%: 13.0 CDE: 10.75 Lot #               [id number] 04/14/2016: CATARACT EXTRACTION W/PHACO; Left     Comment:  Procedure: CATARACT EXTRACTION PHACO AND INTRAOCULAR               LENS PLACEMENT (IOC);  Surgeon: Nevada Crane, MD;                Location: [facility] ORS;  Service: Ophthalmology;  Laterality:              Left;  [place] 1.27 AP% 10.8 CDE 9.48 Fluid Pack lot #                [id number] No date: COLONOSCOPY No date: EYE SURGERY 02/11/2015: GAS/FLUID EXCHANGE; Right     Comment:  Procedure: GAS/FLUID EXCHANGE;  Surgeon: Marcelene Butte, MD;  Location: [facility] ORS;  Service:               Ophthalmology;  Laterality: Right;  24%  SF6 No date: LITHOTRIPSY 02/11/2015: MEMBRANE PEEL; Right     Comment:  Procedure: MEMBRANE PEEL;  Surgeon: Marcelene Butte,               MD;  Location: [facility] ORS;  Service: Ophthalmology;                Laterality: Right; 02/11/2015: PARS PLANA VITRECTOMY W/ SCLERAL BUCKLE; Right     Comment:  Procedure: PARS PLANA VITRECTOMY WITH LASER FOR MACULAR               HOLE;  Surgeon: Marcelene Butte, MD;  Location: [facility]               ORS;  Service: Ophthalmology;  Laterality: Right;  Power200 Duration200 Interval200 Shot count117 Total               energy 4.61 joules BMI    Body Mass Index: 22.67 kg/m     Reproductive/Obstetrics negative OB ROS                             Anesthesia Physical Anesthesia Plan  ASA: III  Anesthesia Plan: General   Post-op Pain Management:    Induction:   PONV Risk Score and Plan:   Airway Management Planned:   Additional Equipment:   Intra-op Plan:   Post-operative Plan:   Informed Consent: I have reviewed the patients History and Physical, chart, labs and discussed the procedure including the risks, benefits and alternatives for the proposed anesthesia with the patient or authorized representative who has indicated his/her understanding and acceptance.     Dental Advisory Given  Plan Discussed with: CRNA  Anesthesia Plan Comments:         Anesthesia Quick Evaluation

## 2020-04-27 NOTE — Interval H&P Note (Signed)
History and Physical Interval Note:  04/27/2020 11:37 AM  Manuel Bowen.  has presented today for surgery, with the diagnosis of HX ADEN POLYPS.  The various methods of treatment have been discussed with the patient and family. After consideration of risks, benefits and other options for treatment, the patient has consented to  Procedure(s): COLONOSCOPY WITH PROPOFOL (N/A) as a surgical intervention.  The patient's history has been reviewed, patient examined, no change in status, stable for surgery.  I have reviewed the patient's chart and labs.  Questions were answered to the patient's satisfaction.     Regis Bill  Ok to proceed with colonoscopy

## 2020-04-27 NOTE — Transfer of Care (Signed)
Immediate Anesthesia Transfer of Care Note  Patient: Manuel Bowen.  Procedure(s) Performed: COLONOSCOPY WITH PROPOFOL (N/A )  Patient Location: PACU  Anesthesia Type:General  Level of Consciousness: awake and sedated  Airway & Oxygen Therapy: Patient Spontanous Breathing and Patient connected to nasal cannula oxygen  Post-op Assessment: Report given to RN and Post -op Vital signs reviewed and stable  Post vital signs: Reviewed and stable  Last Vitals:  Vitals Value Taken Time  BP    Temp    Pulse    Resp    SpO2      Last Pain:  Vitals:   04/27/20 1038  TempSrc: Temporal  PainSc: 0-No pain         Complications: No complications documented.

## 2020-04-28 ENCOUNTER — Encounter: Payer: Self-pay | Admitting: Gastroenterology

## 2020-04-28 NOTE — Anesthesia Postprocedure Evaluation (Signed)
Anesthesia Post Note  Patient: Levaughn Puccinelli.  Procedure(s) Performed: COLONOSCOPY WITH PROPOFOL (N/A )  Patient location during evaluation: PACU Anesthesia Type: General Level of consciousness: awake and alert Pain management: pain level controlled Vital Signs Assessment: post-procedure vital signs reviewed and stable Respiratory status: spontaneous breathing, nonlabored ventilation, respiratory function stable and patient connected to nasal cannula oxygen Cardiovascular status: blood pressure returned to baseline and stable Postop Assessment: no apparent nausea or vomiting Anesthetic complications: no   No complications documented.   Last Vitals:  Vitals:   04/27/20 1215 04/27/20 1225  BP: 103/62 112/79  Pulse: 70 67  Resp: 20 20  Temp:    SpO2: 99% 98%    Last Pain:  Vitals:   04/27/20 1225  TempSrc:   PainSc: 0-No pain                 Yevette Edwards

## 2020-07-23 DIAGNOSIS — N529 Male erectile dysfunction, unspecified: Secondary | ICD-10-CM | POA: Diagnosis not present

## 2020-07-23 DIAGNOSIS — J449 Chronic obstructive pulmonary disease, unspecified: Secondary | ICD-10-CM | POA: Diagnosis not present

## 2020-07-23 DIAGNOSIS — K4020 Bilateral inguinal hernia, without obstruction or gangrene, not specified as recurrent: Secondary | ICD-10-CM | POA: Diagnosis not present

## 2020-07-23 DIAGNOSIS — I700 Atherosclerosis of aorta: Secondary | ICD-10-CM | POA: Diagnosis not present

## 2020-07-23 DIAGNOSIS — I10 Essential (primary) hypertension: Secondary | ICD-10-CM | POA: Diagnosis not present

## 2020-08-14 ENCOUNTER — Telehealth: Payer: Self-pay | Admitting: *Deleted

## 2020-08-14 NOTE — Telephone Encounter (Signed)
Attempted to contact and schedule lung screening scan. Message left for patient to call back to schedule. 

## 2020-08-17 ENCOUNTER — Other Ambulatory Visit: Payer: Self-pay | Admitting: *Deleted

## 2020-08-17 DIAGNOSIS — R918 Other nonspecific abnormal finding of lung field: Secondary | ICD-10-CM

## 2020-08-17 DIAGNOSIS — Z87891 Personal history of nicotine dependence: Secondary | ICD-10-CM

## 2020-08-17 NOTE — Progress Notes (Signed)
Contacted and scheduled for LCS nodule follow up scan. Patient is a current smoker with a 65 pack year history.

## 2020-08-25 ENCOUNTER — Ambulatory Visit
Admission: RE | Admit: 2020-08-25 | Discharge: 2020-08-25 | Disposition: A | Discharge status: Home / Self Care | Payer: PPO | Source: Ambulatory Visit | Attending: Nurse Practitioner | Admitting: Nurse Practitioner

## 2020-08-25 ENCOUNTER — Other Ambulatory Visit: Payer: Self-pay

## 2020-08-25 DIAGNOSIS — J8410 Pulmonary fibrosis, unspecified: Secondary | ICD-10-CM | POA: Diagnosis not present

## 2020-08-25 DIAGNOSIS — R918 Other nonspecific abnormal finding of lung field: Secondary | ICD-10-CM | POA: Insufficient documentation

## 2020-08-25 DIAGNOSIS — Z87891 Personal history of nicotine dependence: Secondary | ICD-10-CM | POA: Diagnosis not present

## 2020-08-25 DIAGNOSIS — J432 Centrilobular emphysema: Secondary | ICD-10-CM | POA: Diagnosis not present

## 2020-08-25 DIAGNOSIS — I2510 Atherosclerotic heart disease of native coronary artery without angina pectoris: Secondary | ICD-10-CM | POA: Diagnosis not present

## 2020-08-25 DIAGNOSIS — J984 Other disorders of lung: Secondary | ICD-10-CM | POA: Diagnosis not present

## 2020-08-27 ENCOUNTER — Encounter: Payer: Self-pay | Admitting: *Deleted

## 2020-09-14 DIAGNOSIS — R0602 Shortness of breath: Secondary | ICD-10-CM | POA: Diagnosis not present

## 2020-09-14 DIAGNOSIS — F17200 Nicotine dependence, unspecified, uncomplicated: Secondary | ICD-10-CM | POA: Diagnosis not present

## 2020-09-14 DIAGNOSIS — I10 Essential (primary) hypertension: Secondary | ICD-10-CM | POA: Diagnosis not present

## 2020-09-18 DIAGNOSIS — R0602 Shortness of breath: Secondary | ICD-10-CM | POA: Diagnosis not present

## 2020-10-20 DIAGNOSIS — H35341 Macular cyst, hole, or pseudohole, right eye: Secondary | ICD-10-CM | POA: Diagnosis not present

## 2021-01-12 DIAGNOSIS — R0602 Shortness of breath: Secondary | ICD-10-CM | POA: Diagnosis not present

## 2021-01-12 DIAGNOSIS — I420 Dilated cardiomyopathy: Secondary | ICD-10-CM | POA: Diagnosis not present

## 2021-01-12 DIAGNOSIS — I5022 Chronic systolic (congestive) heart failure: Secondary | ICD-10-CM | POA: Diagnosis present

## 2021-01-12 DIAGNOSIS — I10 Essential (primary) hypertension: Secondary | ICD-10-CM | POA: Diagnosis not present

## 2021-01-12 DIAGNOSIS — F17200 Nicotine dependence, unspecified, uncomplicated: Secondary | ICD-10-CM | POA: Diagnosis not present

## 2021-02-09 DIAGNOSIS — I5022 Chronic systolic (congestive) heart failure: Secondary | ICD-10-CM | POA: Diagnosis not present

## 2021-02-09 DIAGNOSIS — N529 Male erectile dysfunction, unspecified: Secondary | ICD-10-CM | POA: Diagnosis not present

## 2021-02-09 DIAGNOSIS — K4020 Bilateral inguinal hernia, without obstruction or gangrene, not specified as recurrent: Secondary | ICD-10-CM | POA: Diagnosis not present

## 2021-02-09 DIAGNOSIS — I10 Essential (primary) hypertension: Secondary | ICD-10-CM | POA: Diagnosis not present

## 2021-02-09 DIAGNOSIS — Z0000 Encounter for general adult medical examination without abnormal findings: Secondary | ICD-10-CM | POA: Diagnosis not present

## 2021-02-09 DIAGNOSIS — I420 Dilated cardiomyopathy: Secondary | ICD-10-CM | POA: Diagnosis not present

## 2021-02-09 DIAGNOSIS — I700 Atherosclerosis of aorta: Secondary | ICD-10-CM | POA: Diagnosis not present

## 2021-02-09 DIAGNOSIS — J449 Chronic obstructive pulmonary disease, unspecified: Secondary | ICD-10-CM | POA: Diagnosis not present

## 2021-02-22 DIAGNOSIS — I420 Dilated cardiomyopathy: Secondary | ICD-10-CM | POA: Diagnosis not present

## 2021-02-22 DIAGNOSIS — I5022 Chronic systolic (congestive) heart failure: Secondary | ICD-10-CM | POA: Diagnosis not present

## 2021-02-22 DIAGNOSIS — R0602 Shortness of breath: Secondary | ICD-10-CM | POA: Diagnosis not present

## 2021-03-01 DIAGNOSIS — I5022 Chronic systolic (congestive) heart failure: Secondary | ICD-10-CM | POA: Diagnosis not present

## 2021-03-01 DIAGNOSIS — I420 Dilated cardiomyopathy: Secondary | ICD-10-CM | POA: Diagnosis not present

## 2021-03-01 DIAGNOSIS — Z01810 Encounter for preprocedural cardiovascular examination: Secondary | ICD-10-CM | POA: Diagnosis not present

## 2021-03-01 DIAGNOSIS — I10 Essential (primary) hypertension: Secondary | ICD-10-CM | POA: Diagnosis not present

## 2021-03-01 DIAGNOSIS — R0602 Shortness of breath: Secondary | ICD-10-CM | POA: Diagnosis not present

## 2021-03-01 DIAGNOSIS — F17200 Nicotine dependence, unspecified, uncomplicated: Secondary | ICD-10-CM | POA: Diagnosis not present

## 2021-03-11 ENCOUNTER — Encounter
Admission: RE | Disposition: A | Discharge status: Home / Self Care | Payer: Self-pay | Source: Home / Self Care | Attending: Cardiology

## 2021-03-11 ENCOUNTER — Other Ambulatory Visit: Payer: Self-pay

## 2021-03-11 ENCOUNTER — Ambulatory Visit
Admission: RE | Admit: 2021-03-11 | Discharge: 2021-03-11 | Disposition: A | Discharge status: Home / Self Care | Payer: PPO | Attending: Cardiology | Admitting: Cardiology

## 2021-03-11 ENCOUNTER — Encounter: Payer: Self-pay | Admitting: Cardiology

## 2021-03-11 DIAGNOSIS — R9430 Abnormal result of cardiovascular function study, unspecified: Secondary | ICD-10-CM | POA: Diagnosis present

## 2021-03-11 DIAGNOSIS — I420 Dilated cardiomyopathy: Secondary | ICD-10-CM | POA: Insufficient documentation

## 2021-03-11 DIAGNOSIS — I2510 Atherosclerotic heart disease of native coronary artery without angina pectoris: Secondary | ICD-10-CM | POA: Diagnosis not present

## 2021-03-11 DIAGNOSIS — I429 Cardiomyopathy, unspecified: Secondary | ICD-10-CM

## 2021-03-11 DIAGNOSIS — I25119 Atherosclerotic heart disease of native coronary artery with unspecified angina pectoris: Secondary | ICD-10-CM | POA: Diagnosis not present

## 2021-03-11 DIAGNOSIS — I2582 Chronic total occlusion of coronary artery: Secondary | ICD-10-CM | POA: Insufficient documentation

## 2021-03-11 HISTORY — PX: LEFT HEART CATH AND CORONARY ANGIOGRAPHY: CATH118249

## 2021-03-11 LAB — CBC
HCT: 44.4 % (ref 39.0–52.0)
Hemoglobin: 15.8 g/dL (ref 13.0–17.0)
MCH: 33.3 pg (ref 26.0–34.0)
MCHC: 35.6 g/dL (ref 30.0–36.0)
MCV: 93.7 fL (ref 80.0–100.0)
Platelets: 228 10*3/uL (ref 150–400)
RBC: 4.74 MIL/uL (ref 4.22–5.81)
RDW: 13.2 % (ref 11.5–15.5)
WBC: 8.4 10*3/uL (ref 4.0–10.5)
nRBC: 0 % (ref 0.0–0.2)

## 2021-03-11 SURGERY — LEFT HEART CATH AND CORONARY ANGIOGRAPHY
Anesthesia: Moderate Sedation

## 2021-03-11 MED ORDER — MIDAZOLAM HCL 2 MG/2ML IJ SOLN
INTRAMUSCULAR | Status: DC | PRN
  Administered 2021-03-11: 1 mg via INTRAVENOUS

## 2021-03-11 MED ORDER — SODIUM CHLORIDE 0.9 % WEIGHT BASED INFUSION: 1.0000 mL/kg/h | INTRAVENOUS | Status: DC

## 2021-03-11 MED ORDER — ONDANSETRON HCL 4 MG/2ML IJ SOLN: 4.0000 mg | Freq: Four times a day (QID) | INTRAMUSCULAR | Status: DC | PRN

## 2021-03-11 MED ORDER — SODIUM CHLORIDE 0.9% FLUSH: 3.0000 mL | INTRAVENOUS | Status: DC | PRN

## 2021-03-11 MED ORDER — HYDRALAZINE HCL 20 MG/ML IJ SOLN: 10.0000 mg | INTRAMUSCULAR | Status: DC | PRN

## 2021-03-11 MED ORDER — LIDOCAINE HCL (PF) 1 % IJ SOLN
INTRAMUSCULAR | Status: DC | PRN
  Administered 2021-03-11: 2 mL

## 2021-03-11 MED ORDER — HEPARIN (PORCINE) IN NACL 1000-0.9 UT/500ML-% IV SOLN
INTRAVENOUS | Status: DC | PRN
  Administered 2021-03-11: 1000 mL

## 2021-03-11 MED ORDER — IOHEXOL 350 MG/ML SOLN
INTRAVENOUS | Status: DC | PRN
  Administered 2021-03-11: 56 mL

## 2021-03-11 MED ORDER — FENTANYL CITRATE PF 50 MCG/ML IJ SOSY
PREFILLED_SYRINGE | INTRAMUSCULAR | Status: AC
  Filled 2021-03-11: qty 1

## 2021-03-11 MED ORDER — HEPARIN (PORCINE) IN NACL 1000-0.9 UT/500ML-% IV SOLN
INTRAVENOUS | Status: AC
  Filled 2021-03-11: qty 1000

## 2021-03-11 MED ORDER — SODIUM CHLORIDE 0.9 % WEIGHT BASED INFUSION
71.1000 mL/h | INTRAVENOUS | Status: DC
  Administered 2021-03-11: 71.1 mL/h via INTRAVENOUS

## 2021-03-11 MED ORDER — SODIUM CHLORIDE 0.9% FLUSH
3.0000 mL | Freq: Two times a day (BID) | INTRAVENOUS | Status: DC
Start: 2021-03-11 — End: 2021-03-11

## 2021-03-11 MED ORDER — SODIUM CHLORIDE 0.9 % IV SOLN: 250.0000 mL | INTRAVENOUS | Status: DC | PRN

## 2021-03-11 MED ORDER — LIDOCAINE HCL 1 % IJ SOLN
INTRAMUSCULAR | Status: AC
  Filled 2021-03-11: qty 20

## 2021-03-11 MED ORDER — SODIUM CHLORIDE 0.9 % WEIGHT BASED INFUSION
215.1000 mL/h | INTRAVENOUS | Status: AC
  Administered 2021-03-11: 215.1 mL/h via INTRAVENOUS

## 2021-03-11 MED ORDER — ACETAMINOPHEN 325 MG PO TABS: 650.0000 mg | ORAL_TABLET | ORAL | Status: DC | PRN

## 2021-03-11 MED ORDER — SODIUM CHLORIDE 0.9% FLUSH: 3.0000 mL | Freq: Two times a day (BID) | INTRAVENOUS | Status: DC

## 2021-03-11 MED ORDER — MIDAZOLAM HCL 2 MG/2ML IJ SOLN
INTRAMUSCULAR | Status: AC
  Filled 2021-03-11: qty 2

## 2021-03-11 MED ORDER — HEPARIN SODIUM (PORCINE) 1000 UNIT/ML IJ SOLN
INTRAMUSCULAR | Status: AC
  Filled 2021-03-11: qty 1

## 2021-03-11 MED ORDER — FENTANYL CITRATE (PF) 100 MCG/2ML IJ SOLN
INTRAMUSCULAR | Status: DC | PRN
  Administered 2021-03-11: 25 ug via INTRAVENOUS

## 2021-03-11 MED ORDER — LABETALOL HCL 5 MG/ML IV SOLN: 10.0000 mg | INTRAVENOUS | Status: DC | PRN

## 2021-03-11 MED ORDER — VERAPAMIL HCL 2.5 MG/ML IV SOLN
INTRAVENOUS | Status: DC | PRN
  Administered 2021-03-11: 2.5 mg via INTRA_ARTERIAL

## 2021-03-11 MED ORDER — HEPARIN SODIUM (PORCINE) 1000 UNIT/ML IJ SOLN
INTRAMUSCULAR | Status: DC | PRN
  Administered 2021-03-11: 3500 [IU] via INTRAVENOUS

## 2021-03-11 MED ORDER — ASPIRIN 81 MG PO CHEW: 81.0000 mg | CHEWABLE_TABLET | ORAL | Status: DC

## 2021-03-11 MED ORDER — VERAPAMIL HCL 2.5 MG/ML IV SOLN
INTRAVENOUS | Status: AC
  Filled 2021-03-11: qty 2

## 2021-03-11 SURGICAL SUPPLY — 11 items

## 2021-03-12 ENCOUNTER — Encounter: Payer: Self-pay | Admitting: Cardiology

## 2021-03-19 DIAGNOSIS — R0602 Shortness of breath: Secondary | ICD-10-CM | POA: Diagnosis not present

## 2021-03-19 DIAGNOSIS — Z9889 Other specified postprocedural states: Secondary | ICD-10-CM | POA: Diagnosis not present

## 2021-03-19 DIAGNOSIS — F17200 Nicotine dependence, unspecified, uncomplicated: Secondary | ICD-10-CM | POA: Diagnosis not present

## 2021-03-19 DIAGNOSIS — I5022 Chronic systolic (congestive) heart failure: Secondary | ICD-10-CM | POA: Diagnosis not present

## 2021-03-19 DIAGNOSIS — I420 Dilated cardiomyopathy: Secondary | ICD-10-CM | POA: Diagnosis not present

## 2021-03-19 DIAGNOSIS — I10 Essential (primary) hypertension: Secondary | ICD-10-CM | POA: Diagnosis not present

## 2021-04-12 DIAGNOSIS — I2510 Atherosclerotic heart disease of native coronary artery without angina pectoris: Secondary | ICD-10-CM | POA: Diagnosis not present

## 2021-04-12 DIAGNOSIS — I208 Other forms of angina pectoris: Secondary | ICD-10-CM | POA: Diagnosis not present

## 2021-04-20 DIAGNOSIS — G4700 Insomnia, unspecified: Secondary | ICD-10-CM | POA: Diagnosis not present

## 2021-04-20 DIAGNOSIS — I25118 Atherosclerotic heart disease of native coronary artery with other forms of angina pectoris: Secondary | ICD-10-CM | POA: Diagnosis not present

## 2021-04-20 DIAGNOSIS — F17210 Nicotine dependence, cigarettes, uncomplicated: Secondary | ICD-10-CM | POA: Diagnosis not present

## 2021-04-20 DIAGNOSIS — I4892 Unspecified atrial flutter: Secondary | ICD-10-CM | POA: Diagnosis not present

## 2021-04-20 DIAGNOSIS — R338 Other retention of urine: Secondary | ICD-10-CM | POA: Diagnosis not present

## 2021-04-20 DIAGNOSIS — T380X5A Adverse effect of glucocorticoids and synthetic analogues, initial encounter: Secondary | ICD-10-CM | POA: Diagnosis not present

## 2021-04-20 DIAGNOSIS — I5022 Chronic systolic (congestive) heart failure: Secondary | ICD-10-CM | POA: Diagnosis not present

## 2021-04-20 DIAGNOSIS — J449 Chronic obstructive pulmonary disease, unspecified: Secondary | ICD-10-CM | POA: Diagnosis not present

## 2021-04-20 DIAGNOSIS — I2584 Coronary atherosclerosis due to calcified coronary lesion: Secondary | ICD-10-CM | POA: Diagnosis not present

## 2021-04-20 DIAGNOSIS — I951 Orthostatic hypotension: Secondary | ICD-10-CM | POA: Diagnosis not present

## 2021-04-20 DIAGNOSIS — R140 Abdominal distension (gaseous): Secondary | ICD-10-CM | POA: Diagnosis not present

## 2021-04-20 DIAGNOSIS — G8918 Other acute postprocedural pain: Secondary | ICD-10-CM | POA: Diagnosis not present

## 2021-04-20 DIAGNOSIS — I2510 Atherosclerotic heart disease of native coronary artery without angina pectoris: Secondary | ICD-10-CM | POA: Diagnosis not present

## 2021-04-20 DIAGNOSIS — R4182 Altered mental status, unspecified: Secondary | ICD-10-CM | POA: Diagnosis not present

## 2021-04-20 DIAGNOSIS — J90 Pleural effusion, not elsewhere classified: Secondary | ICD-10-CM | POA: Diagnosis not present

## 2021-04-20 DIAGNOSIS — I10 Essential (primary) hypertension: Secondary | ICD-10-CM | POA: Diagnosis not present

## 2021-04-20 DIAGNOSIS — N390 Urinary tract infection, site not specified: Secondary | ICD-10-CM | POA: Diagnosis not present

## 2021-04-20 DIAGNOSIS — N401 Enlarged prostate with lower urinary tract symptoms: Secondary | ICD-10-CM | POA: Diagnosis not present

## 2021-04-20 DIAGNOSIS — R739 Hyperglycemia, unspecified: Secondary | ICD-10-CM | POA: Diagnosis not present

## 2021-04-20 DIAGNOSIS — Z452 Encounter for adjustment and management of vascular access device: Secondary | ICD-10-CM | POA: Diagnosis not present

## 2021-04-20 DIAGNOSIS — R918 Other nonspecific abnormal finding of lung field: Secondary | ICD-10-CM | POA: Diagnosis not present

## 2021-04-20 DIAGNOSIS — Z4682 Encounter for fitting and adjustment of non-vascular catheter: Secondary | ICD-10-CM | POA: Diagnosis not present

## 2021-04-20 DIAGNOSIS — J811 Chronic pulmonary edema: Secondary | ICD-10-CM | POA: Diagnosis not present

## 2021-04-20 DIAGNOSIS — D696 Thrombocytopenia, unspecified: Secondary | ICD-10-CM | POA: Diagnosis not present

## 2021-04-20 DIAGNOSIS — Z8249 Family history of ischemic heart disease and other diseases of the circulatory system: Secondary | ICD-10-CM | POA: Diagnosis not present

## 2021-04-20 DIAGNOSIS — E785 Hyperlipidemia, unspecified: Secondary | ICD-10-CM | POA: Diagnosis not present

## 2021-04-20 DIAGNOSIS — Z20822 Contact with and (suspected) exposure to covid-19: Secondary | ICD-10-CM | POA: Diagnosis not present

## 2021-04-20 DIAGNOSIS — I340 Nonrheumatic mitral (valve) insufficiency: Secondary | ICD-10-CM | POA: Diagnosis not present

## 2021-04-20 DIAGNOSIS — J9811 Atelectasis: Secondary | ICD-10-CM | POA: Diagnosis not present

## 2021-04-20 DIAGNOSIS — Z951 Presence of aortocoronary bypass graft: Secondary | ICD-10-CM | POA: Diagnosis not present

## 2021-04-20 DIAGNOSIS — I9789 Other postprocedural complications and disorders of the circulatory system, not elsewhere classified: Secondary | ICD-10-CM | POA: Diagnosis not present

## 2021-04-20 DIAGNOSIS — I483 Typical atrial flutter: Secondary | ICD-10-CM | POA: Diagnosis not present

## 2021-04-20 DIAGNOSIS — Z79899 Other long term (current) drug therapy: Secondary | ICD-10-CM | POA: Diagnosis not present

## 2021-04-20 DIAGNOSIS — R0602 Shortness of breath: Secondary | ICD-10-CM | POA: Diagnosis not present

## 2021-04-20 DIAGNOSIS — Z7982 Long term (current) use of aspirin: Secondary | ICD-10-CM | POA: Diagnosis not present

## 2021-04-20 DIAGNOSIS — I4891 Unspecified atrial fibrillation: Secondary | ICD-10-CM | POA: Diagnosis not present

## 2021-04-20 DIAGNOSIS — F17200 Nicotine dependence, unspecified, uncomplicated: Secondary | ICD-10-CM | POA: Diagnosis not present

## 2021-04-20 DIAGNOSIS — I110 Hypertensive heart disease with heart failure: Secondary | ICD-10-CM | POA: Diagnosis not present

## 2021-04-20 DIAGNOSIS — D62 Acute posthemorrhagic anemia: Secondary | ICD-10-CM | POA: Diagnosis not present

## 2021-04-20 DIAGNOSIS — I420 Dilated cardiomyopathy: Secondary | ICD-10-CM | POA: Diagnosis not present

## 2021-04-20 DIAGNOSIS — R1312 Dysphagia, oropharyngeal phase: Secondary | ICD-10-CM | POA: Diagnosis not present

## 2021-04-20 DIAGNOSIS — I739 Peripheral vascular disease, unspecified: Secondary | ICD-10-CM | POA: Diagnosis not present

## 2021-04-20 DIAGNOSIS — Z9889 Other specified postprocedural states: Secondary | ICD-10-CM | POA: Diagnosis not present

## 2021-04-21 DIAGNOSIS — Z951 Presence of aortocoronary bypass graft: Secondary | ICD-10-CM

## 2021-04-27 DIAGNOSIS — Z7901 Long term (current) use of anticoagulants: Secondary | ICD-10-CM | POA: Insufficient documentation

## 2021-05-10 DIAGNOSIS — I340 Nonrheumatic mitral (valve) insufficiency: Secondary | ICD-10-CM | POA: Diagnosis not present

## 2021-05-10 DIAGNOSIS — Z951 Presence of aortocoronary bypass graft: Secondary | ICD-10-CM | POA: Diagnosis not present

## 2021-05-10 DIAGNOSIS — R339 Retention of urine, unspecified: Secondary | ICD-10-CM | POA: Diagnosis not present

## 2021-05-10 DIAGNOSIS — I959 Hypotension, unspecified: Secondary | ICD-10-CM | POA: Diagnosis not present

## 2021-05-10 DIAGNOSIS — Z9181 History of falling: Secondary | ICD-10-CM | POA: Diagnosis not present

## 2021-05-10 DIAGNOSIS — I4891 Unspecified atrial fibrillation: Secondary | ICD-10-CM | POA: Diagnosis not present

## 2021-05-10 DIAGNOSIS — D696 Thrombocytopenia, unspecified: Secondary | ICD-10-CM | POA: Diagnosis not present

## 2021-05-10 DIAGNOSIS — F17210 Nicotine dependence, cigarettes, uncomplicated: Secondary | ICD-10-CM | POA: Diagnosis not present

## 2021-05-10 DIAGNOSIS — K4090 Unilateral inguinal hernia, without obstruction or gangrene, not specified as recurrent: Secondary | ICD-10-CM | POA: Diagnosis not present

## 2021-05-10 DIAGNOSIS — I5022 Chronic systolic (congestive) heart failure: Secondary | ICD-10-CM | POA: Diagnosis not present

## 2021-05-10 DIAGNOSIS — I110 Hypertensive heart disease with heart failure: Secondary | ICD-10-CM | POA: Diagnosis not present

## 2021-05-10 DIAGNOSIS — J439 Emphysema, unspecified: Secondary | ICD-10-CM | POA: Diagnosis not present

## 2021-05-10 DIAGNOSIS — I071 Rheumatic tricuspid insufficiency: Secondary | ICD-10-CM | POA: Diagnosis not present

## 2021-05-10 DIAGNOSIS — E785 Hyperlipidemia, unspecified: Secondary | ICD-10-CM | POA: Diagnosis not present

## 2021-05-10 DIAGNOSIS — Z48812 Encounter for surgical aftercare following surgery on the circulatory system: Secondary | ICD-10-CM | POA: Diagnosis not present

## 2021-05-10 DIAGNOSIS — I2510 Atherosclerotic heart disease of native coronary artery without angina pectoris: Secondary | ICD-10-CM | POA: Diagnosis not present

## 2021-05-10 DIAGNOSIS — Z95818 Presence of other cardiac implants and grafts: Secondary | ICD-10-CM | POA: Diagnosis not present

## 2021-05-10 DIAGNOSIS — K5730 Diverticulosis of large intestine without perforation or abscess without bleeding: Secondary | ICD-10-CM | POA: Diagnosis not present

## 2021-05-10 DIAGNOSIS — I420 Dilated cardiomyopathy: Secondary | ICD-10-CM | POA: Diagnosis not present

## 2021-05-10 DIAGNOSIS — N390 Urinary tract infection, site not specified: Secondary | ICD-10-CM | POA: Diagnosis not present

## 2021-05-10 DIAGNOSIS — Z7982 Long term (current) use of aspirin: Secondary | ICD-10-CM | POA: Diagnosis not present

## 2021-05-10 DIAGNOSIS — D62 Acute posthemorrhagic anemia: Secondary | ICD-10-CM | POA: Diagnosis not present

## 2021-05-17 DIAGNOSIS — J9811 Atelectasis: Secondary | ICD-10-CM | POA: Diagnosis not present

## 2021-05-17 DIAGNOSIS — I459 Conduction disorder, unspecified: Secondary | ICD-10-CM | POA: Diagnosis not present

## 2021-05-17 DIAGNOSIS — J90 Pleural effusion, not elsewhere classified: Secondary | ICD-10-CM | POA: Diagnosis not present

## 2021-05-17 DIAGNOSIS — Z9889 Other specified postprocedural states: Secondary | ICD-10-CM | POA: Diagnosis not present

## 2021-05-17 DIAGNOSIS — I959 Hypotension, unspecified: Secondary | ICD-10-CM | POA: Diagnosis not present

## 2021-05-17 DIAGNOSIS — Z86010 Personal history of colonic polyps: Secondary | ICD-10-CM | POA: Diagnosis not present

## 2021-05-17 DIAGNOSIS — Z48812 Encounter for surgical aftercare following surgery on the circulatory system: Secondary | ICD-10-CM | POA: Diagnosis not present

## 2021-05-17 DIAGNOSIS — Z951 Presence of aortocoronary bypass graft: Secondary | ICD-10-CM | POA: Diagnosis not present

## 2021-05-18 DIAGNOSIS — E785 Hyperlipidemia, unspecified: Secondary | ICD-10-CM | POA: Diagnosis not present

## 2021-05-18 DIAGNOSIS — K4090 Unilateral inguinal hernia, without obstruction or gangrene, not specified as recurrent: Secondary | ICD-10-CM | POA: Diagnosis not present

## 2021-05-18 DIAGNOSIS — F17210 Nicotine dependence, cigarettes, uncomplicated: Secondary | ICD-10-CM | POA: Diagnosis not present

## 2021-05-18 DIAGNOSIS — D62 Acute posthemorrhagic anemia: Secondary | ICD-10-CM | POA: Diagnosis not present

## 2021-05-18 DIAGNOSIS — Z95818 Presence of other cardiac implants and grafts: Secondary | ICD-10-CM | POA: Diagnosis not present

## 2021-05-18 DIAGNOSIS — Z9181 History of falling: Secondary | ICD-10-CM | POA: Diagnosis not present

## 2021-05-18 DIAGNOSIS — I420 Dilated cardiomyopathy: Secondary | ICD-10-CM | POA: Diagnosis not present

## 2021-05-18 DIAGNOSIS — I2510 Atherosclerotic heart disease of native coronary artery without angina pectoris: Secondary | ICD-10-CM | POA: Diagnosis not present

## 2021-05-18 DIAGNOSIS — I110 Hypertensive heart disease with heart failure: Secondary | ICD-10-CM | POA: Diagnosis not present

## 2021-05-18 DIAGNOSIS — J439 Emphysema, unspecified: Secondary | ICD-10-CM | POA: Diagnosis not present

## 2021-05-18 DIAGNOSIS — I959 Hypotension, unspecified: Secondary | ICD-10-CM | POA: Diagnosis not present

## 2021-05-18 DIAGNOSIS — I071 Rheumatic tricuspid insufficiency: Secondary | ICD-10-CM | POA: Diagnosis not present

## 2021-05-18 DIAGNOSIS — Z48812 Encounter for surgical aftercare following surgery on the circulatory system: Secondary | ICD-10-CM | POA: Diagnosis not present

## 2021-05-18 DIAGNOSIS — D696 Thrombocytopenia, unspecified: Secondary | ICD-10-CM | POA: Diagnosis not present

## 2021-05-18 DIAGNOSIS — Z7982 Long term (current) use of aspirin: Secondary | ICD-10-CM | POA: Diagnosis not present

## 2021-05-18 DIAGNOSIS — I340 Nonrheumatic mitral (valve) insufficiency: Secondary | ICD-10-CM | POA: Diagnosis not present

## 2021-05-18 DIAGNOSIS — K5730 Diverticulosis of large intestine without perforation or abscess without bleeding: Secondary | ICD-10-CM | POA: Diagnosis not present

## 2021-05-18 DIAGNOSIS — Z951 Presence of aortocoronary bypass graft: Secondary | ICD-10-CM | POA: Diagnosis not present

## 2021-05-18 DIAGNOSIS — I5022 Chronic systolic (congestive) heart failure: Secondary | ICD-10-CM | POA: Diagnosis not present

## 2021-05-18 DIAGNOSIS — R339 Retention of urine, unspecified: Secondary | ICD-10-CM | POA: Diagnosis not present

## 2021-05-18 DIAGNOSIS — N390 Urinary tract infection, site not specified: Secondary | ICD-10-CM | POA: Diagnosis not present

## 2021-05-18 DIAGNOSIS — I4891 Unspecified atrial fibrillation: Secondary | ICD-10-CM | POA: Diagnosis not present

## 2021-05-19 DIAGNOSIS — I2510 Atherosclerotic heart disease of native coronary artery without angina pectoris: Secondary | ICD-10-CM | POA: Diagnosis not present

## 2021-05-19 DIAGNOSIS — I071 Rheumatic tricuspid insufficiency: Secondary | ICD-10-CM | POA: Diagnosis not present

## 2021-05-19 DIAGNOSIS — I340 Nonrheumatic mitral (valve) insufficiency: Secondary | ICD-10-CM | POA: Diagnosis not present

## 2021-05-19 DIAGNOSIS — Z48812 Encounter for surgical aftercare following surgery on the circulatory system: Secondary | ICD-10-CM | POA: Diagnosis not present

## 2021-05-19 DIAGNOSIS — D62 Acute posthemorrhagic anemia: Secondary | ICD-10-CM | POA: Diagnosis not present

## 2021-05-19 DIAGNOSIS — I110 Hypertensive heart disease with heart failure: Secondary | ICD-10-CM | POA: Diagnosis not present

## 2021-05-19 DIAGNOSIS — N390 Urinary tract infection, site not specified: Secondary | ICD-10-CM | POA: Diagnosis not present

## 2021-05-21 DIAGNOSIS — I4891 Unspecified atrial fibrillation: Secondary | ICD-10-CM | POA: Diagnosis not present

## 2021-05-21 DIAGNOSIS — I10 Essential (primary) hypertension: Secondary | ICD-10-CM | POA: Diagnosis not present

## 2021-05-21 DIAGNOSIS — R0602 Shortness of breath: Secondary | ICD-10-CM | POA: Diagnosis not present

## 2021-05-21 DIAGNOSIS — Z23 Encounter for immunization: Secondary | ICD-10-CM | POA: Diagnosis not present

## 2021-05-21 DIAGNOSIS — I420 Dilated cardiomyopathy: Secondary | ICD-10-CM | POA: Diagnosis not present

## 2021-05-21 DIAGNOSIS — Z951 Presence of aortocoronary bypass graft: Secondary | ICD-10-CM | POA: Diagnosis not present

## 2021-05-21 DIAGNOSIS — I5022 Chronic systolic (congestive) heart failure: Secondary | ICD-10-CM | POA: Diagnosis not present

## 2021-05-21 DIAGNOSIS — I9789 Other postprocedural complications and disorders of the circulatory system, not elsewhere classified: Secondary | ICD-10-CM | POA: Diagnosis not present

## 2021-05-21 DIAGNOSIS — Z9889 Other specified postprocedural states: Secondary | ICD-10-CM | POA: Diagnosis not present

## 2021-06-04 DIAGNOSIS — R339 Retention of urine, unspecified: Secondary | ICD-10-CM | POA: Diagnosis not present

## 2021-06-04 DIAGNOSIS — I420 Dilated cardiomyopathy: Secondary | ICD-10-CM | POA: Diagnosis not present

## 2021-06-04 DIAGNOSIS — I2510 Atherosclerotic heart disease of native coronary artery without angina pectoris: Secondary | ICD-10-CM | POA: Diagnosis not present

## 2021-06-04 DIAGNOSIS — D696 Thrombocytopenia, unspecified: Secondary | ICD-10-CM | POA: Diagnosis not present

## 2021-06-04 DIAGNOSIS — Z95818 Presence of other cardiac implants and grafts: Secondary | ICD-10-CM | POA: Diagnosis not present

## 2021-06-04 DIAGNOSIS — I959 Hypotension, unspecified: Secondary | ICD-10-CM | POA: Diagnosis not present

## 2021-06-04 DIAGNOSIS — K4090 Unilateral inguinal hernia, without obstruction or gangrene, not specified as recurrent: Secondary | ICD-10-CM | POA: Diagnosis not present

## 2021-06-04 DIAGNOSIS — D62 Acute posthemorrhagic anemia: Secondary | ICD-10-CM | POA: Diagnosis not present

## 2021-06-04 DIAGNOSIS — I340 Nonrheumatic mitral (valve) insufficiency: Secondary | ICD-10-CM | POA: Diagnosis not present

## 2021-06-04 DIAGNOSIS — I071 Rheumatic tricuspid insufficiency: Secondary | ICD-10-CM | POA: Diagnosis not present

## 2021-06-04 DIAGNOSIS — F17210 Nicotine dependence, cigarettes, uncomplicated: Secondary | ICD-10-CM | POA: Diagnosis not present

## 2021-06-04 DIAGNOSIS — I4891 Unspecified atrial fibrillation: Secondary | ICD-10-CM | POA: Diagnosis not present

## 2021-06-04 DIAGNOSIS — J439 Emphysema, unspecified: Secondary | ICD-10-CM | POA: Diagnosis not present

## 2021-06-04 DIAGNOSIS — N390 Urinary tract infection, site not specified: Secondary | ICD-10-CM | POA: Diagnosis not present

## 2021-06-04 DIAGNOSIS — E785 Hyperlipidemia, unspecified: Secondary | ICD-10-CM | POA: Diagnosis not present

## 2021-06-04 DIAGNOSIS — Z7982 Long term (current) use of aspirin: Secondary | ICD-10-CM | POA: Diagnosis not present

## 2021-06-04 DIAGNOSIS — Z48812 Encounter for surgical aftercare following surgery on the circulatory system: Secondary | ICD-10-CM | POA: Diagnosis not present

## 2021-06-04 DIAGNOSIS — I5022 Chronic systolic (congestive) heart failure: Secondary | ICD-10-CM | POA: Diagnosis not present

## 2021-06-04 DIAGNOSIS — Z9181 History of falling: Secondary | ICD-10-CM | POA: Diagnosis not present

## 2021-06-04 DIAGNOSIS — I110 Hypertensive heart disease with heart failure: Secondary | ICD-10-CM | POA: Diagnosis not present

## 2021-06-04 DIAGNOSIS — Z951 Presence of aortocoronary bypass graft: Secondary | ICD-10-CM | POA: Diagnosis not present

## 2021-06-04 DIAGNOSIS — K5730 Diverticulosis of large intestine without perforation or abscess without bleeding: Secondary | ICD-10-CM | POA: Diagnosis not present

## 2021-06-05 DIAGNOSIS — I420 Dilated cardiomyopathy: Secondary | ICD-10-CM | POA: Diagnosis not present

## 2021-06-17 DIAGNOSIS — Z23 Encounter for immunization: Secondary | ICD-10-CM | POA: Diagnosis not present

## 2021-06-17 DIAGNOSIS — I2510 Atherosclerotic heart disease of native coronary artery without angina pectoris: Secondary | ICD-10-CM | POA: Diagnosis not present

## 2021-06-17 DIAGNOSIS — Z79899 Other long term (current) drug therapy: Secondary | ICD-10-CM | POA: Diagnosis not present

## 2021-06-17 DIAGNOSIS — Z951 Presence of aortocoronary bypass graft: Secondary | ICD-10-CM | POA: Diagnosis not present

## 2021-06-17 DIAGNOSIS — I5022 Chronic systolic (congestive) heart failure: Secondary | ICD-10-CM | POA: Diagnosis not present

## 2021-06-17 DIAGNOSIS — I9789 Other postprocedural complications and disorders of the circulatory system, not elsewhere classified: Secondary | ICD-10-CM | POA: Diagnosis not present

## 2021-06-17 DIAGNOSIS — I420 Dilated cardiomyopathy: Secondary | ICD-10-CM | POA: Diagnosis not present

## 2021-06-17 DIAGNOSIS — I4891 Unspecified atrial fibrillation: Secondary | ICD-10-CM | POA: Diagnosis not present

## 2021-06-17 DIAGNOSIS — I340 Nonrheumatic mitral (valve) insufficiency: Secondary | ICD-10-CM | POA: Diagnosis not present

## 2021-06-17 DIAGNOSIS — Z7901 Long term (current) use of anticoagulants: Secondary | ICD-10-CM | POA: Diagnosis not present

## 2021-06-23 DIAGNOSIS — I420 Dilated cardiomyopathy: Secondary | ICD-10-CM | POA: Diagnosis not present

## 2021-06-23 DIAGNOSIS — I5022 Chronic systolic (congestive) heart failure: Secondary | ICD-10-CM | POA: Diagnosis not present

## 2021-06-23 DIAGNOSIS — I2510 Atherosclerotic heart disease of native coronary artery without angina pectoris: Secondary | ICD-10-CM | POA: Diagnosis not present

## 2021-06-30 DIAGNOSIS — Z9889 Other specified postprocedural states: Secondary | ICD-10-CM | POA: Diagnosis not present

## 2021-06-30 DIAGNOSIS — I9789 Other postprocedural complications and disorders of the circulatory system, not elsewhere classified: Secondary | ICD-10-CM | POA: Diagnosis not present

## 2021-06-30 DIAGNOSIS — Z23 Encounter for immunization: Secondary | ICD-10-CM | POA: Diagnosis not present

## 2021-06-30 DIAGNOSIS — I5022 Chronic systolic (congestive) heart failure: Secondary | ICD-10-CM | POA: Diagnosis not present

## 2021-06-30 DIAGNOSIS — I10 Essential (primary) hypertension: Secondary | ICD-10-CM | POA: Diagnosis not present

## 2021-06-30 DIAGNOSIS — Z951 Presence of aortocoronary bypass graft: Secondary | ICD-10-CM | POA: Diagnosis not present

## 2021-06-30 DIAGNOSIS — R0602 Shortness of breath: Secondary | ICD-10-CM | POA: Diagnosis not present

## 2021-06-30 DIAGNOSIS — I4891 Unspecified atrial fibrillation: Secondary | ICD-10-CM | POA: Diagnosis not present

## 2021-07-05 DIAGNOSIS — I420 Dilated cardiomyopathy: Secondary | ICD-10-CM | POA: Diagnosis not present

## 2021-07-14 ENCOUNTER — Encounter: Payer: PPO | Attending: Cardiology | Admitting: *Deleted

## 2021-07-14 ENCOUNTER — Other Ambulatory Visit: Payer: Self-pay

## 2021-07-14 ENCOUNTER — Encounter: Payer: Self-pay | Admitting: *Deleted

## 2021-07-14 DIAGNOSIS — Z951 Presence of aortocoronary bypass graft: Secondary | ICD-10-CM

## 2021-07-14 NOTE — Progress Notes (Signed)
Virtual orientation call completed today. he has an appointment on Date: 08/05/2021  for EP eval and gym Orientation.  Documentation of diagnosis can be found in [facility]  Date: 04/20/2021 .   He is waiting for appointment for  insertion of an ICD.

## 2021-07-27 DIAGNOSIS — I5022 Chronic systolic (congestive) heart failure: Secondary | ICD-10-CM | POA: Diagnosis not present

## 2021-07-27 DIAGNOSIS — Z9889 Other specified postprocedural states: Secondary | ICD-10-CM | POA: Diagnosis not present

## 2021-07-27 DIAGNOSIS — U071 COVID-19: Secondary | ICD-10-CM | POA: Diagnosis not present

## 2021-07-27 DIAGNOSIS — I9789 Other postprocedural complications and disorders of the circulatory system, not elsewhere classified: Secondary | ICD-10-CM | POA: Diagnosis not present

## 2021-07-27 DIAGNOSIS — I420 Dilated cardiomyopathy: Secondary | ICD-10-CM | POA: Diagnosis not present

## 2021-07-27 DIAGNOSIS — I4891 Unspecified atrial fibrillation: Secondary | ICD-10-CM | POA: Diagnosis not present

## 2021-07-27 DIAGNOSIS — Z7901 Long term (current) use of anticoagulants: Secondary | ICD-10-CM | POA: Diagnosis not present

## 2021-07-27 DIAGNOSIS — Z01810 Encounter for preprocedural cardiovascular examination: Secondary | ICD-10-CM | POA: Diagnosis not present

## 2021-08-05 ENCOUNTER — Ambulatory Visit: Payer: PPO

## 2021-08-05 DIAGNOSIS — I420 Dilated cardiomyopathy: Secondary | ICD-10-CM | POA: Diagnosis not present

## 2021-08-13 DIAGNOSIS — I9789 Other postprocedural complications and disorders of the circulatory system, not elsewhere classified: Secondary | ICD-10-CM | POA: Diagnosis not present

## 2021-08-13 DIAGNOSIS — J9811 Atelectasis: Secondary | ICD-10-CM | POA: Diagnosis not present

## 2021-08-13 DIAGNOSIS — N183 Chronic kidney disease, stage 3 unspecified: Secondary | ICD-10-CM | POA: Diagnosis not present

## 2021-08-13 DIAGNOSIS — I252 Old myocardial infarction: Secondary | ICD-10-CM | POA: Diagnosis not present

## 2021-08-13 DIAGNOSIS — I495 Sick sinus syndrome: Secondary | ICD-10-CM | POA: Diagnosis not present

## 2021-08-13 DIAGNOSIS — Z7982 Long term (current) use of aspirin: Secondary | ICD-10-CM | POA: Diagnosis not present

## 2021-08-13 DIAGNOSIS — R140 Abdominal distension (gaseous): Secondary | ICD-10-CM | POA: Diagnosis not present

## 2021-08-13 DIAGNOSIS — I255 Ischemic cardiomyopathy: Secondary | ICD-10-CM | POA: Diagnosis not present

## 2021-08-13 DIAGNOSIS — Z951 Presence of aortocoronary bypass graft: Secondary | ICD-10-CM | POA: Diagnosis not present

## 2021-08-13 DIAGNOSIS — Z95810 Presence of automatic (implantable) cardiac defibrillator: Secondary | ICD-10-CM | POA: Diagnosis not present

## 2021-08-13 DIAGNOSIS — D509 Iron deficiency anemia, unspecified: Secondary | ICD-10-CM | POA: Diagnosis not present

## 2021-08-13 DIAGNOSIS — Z87442 Personal history of urinary calculi: Secondary | ICD-10-CM | POA: Diagnosis not present

## 2021-08-13 DIAGNOSIS — E785 Hyperlipidemia, unspecified: Secondary | ICD-10-CM | POA: Diagnosis not present

## 2021-08-13 DIAGNOSIS — N179 Acute kidney failure, unspecified: Secondary | ICD-10-CM | POA: Diagnosis not present

## 2021-08-13 DIAGNOSIS — Z7901 Long term (current) use of anticoagulants: Secondary | ICD-10-CM | POA: Diagnosis not present

## 2021-08-13 DIAGNOSIS — I2510 Atherosclerotic heart disease of native coronary artery without angina pectoris: Secondary | ICD-10-CM | POA: Diagnosis not present

## 2021-08-13 DIAGNOSIS — R4020 Unspecified coma: Secondary | ICD-10-CM | POA: Diagnosis not present

## 2021-08-13 DIAGNOSIS — I110 Hypertensive heart disease with heart failure: Secondary | ICD-10-CM | POA: Diagnosis not present

## 2021-08-13 DIAGNOSIS — J449 Chronic obstructive pulmonary disease, unspecified: Secondary | ICD-10-CM | POA: Diagnosis not present

## 2021-08-13 DIAGNOSIS — W19XXXA Unspecified fall, initial encounter: Secondary | ICD-10-CM | POA: Diagnosis not present

## 2021-08-13 DIAGNOSIS — I472 Ventricular tachycardia, unspecified: Secondary | ICD-10-CM | POA: Diagnosis not present

## 2021-08-13 DIAGNOSIS — I130 Hypertensive heart and chronic kidney disease with heart failure and stage 1 through stage 4 chronic kidney disease, or unspecified chronic kidney disease: Secondary | ICD-10-CM | POA: Diagnosis not present

## 2021-08-13 DIAGNOSIS — I5022 Chronic systolic (congestive) heart failure: Secondary | ICD-10-CM | POA: Diagnosis not present

## 2021-08-13 DIAGNOSIS — R42 Dizziness and giddiness: Secondary | ICD-10-CM | POA: Diagnosis not present

## 2021-08-13 DIAGNOSIS — I4891 Unspecified atrial fibrillation: Secondary | ICD-10-CM | POA: Diagnosis not present

## 2021-08-13 DIAGNOSIS — S0990XA Unspecified injury of head, initial encounter: Secondary | ICD-10-CM | POA: Diagnosis not present

## 2021-08-13 DIAGNOSIS — I340 Nonrheumatic mitral (valve) insufficiency: Secondary | ICD-10-CM | POA: Diagnosis not present

## 2021-08-13 DIAGNOSIS — Z79899 Other long term (current) drug therapy: Secondary | ICD-10-CM | POA: Diagnosis not present

## 2021-08-13 DIAGNOSIS — Z8719 Personal history of other diseases of the digestive system: Secondary | ICD-10-CM | POA: Diagnosis not present

## 2021-08-13 DIAGNOSIS — Z87891 Personal history of nicotine dependence: Secondary | ICD-10-CM | POA: Diagnosis not present

## 2021-08-13 DIAGNOSIS — Z9889 Other specified postprocedural states: Secondary | ICD-10-CM | POA: Diagnosis not present

## 2021-08-13 DIAGNOSIS — I499 Cardiac arrhythmia, unspecified: Secondary | ICD-10-CM | POA: Diagnosis not present

## 2021-08-13 DIAGNOSIS — R55 Syncope and collapse: Secondary | ICD-10-CM | POA: Diagnosis not present

## 2021-08-13 DIAGNOSIS — R0689 Other abnormalities of breathing: Secondary | ICD-10-CM | POA: Diagnosis not present

## 2021-08-13 DIAGNOSIS — Z8616 Personal history of COVID-19: Secondary | ICD-10-CM | POA: Diagnosis not present

## 2021-08-14 DIAGNOSIS — I4891 Unspecified atrial fibrillation: Secondary | ICD-10-CM | POA: Diagnosis not present

## 2021-08-14 DIAGNOSIS — I9789 Other postprocedural complications and disorders of the circulatory system, not elsewhere classified: Secondary | ICD-10-CM | POA: Diagnosis not present

## 2021-08-14 DIAGNOSIS — I5022 Chronic systolic (congestive) heart failure: Secondary | ICD-10-CM | POA: Diagnosis not present

## 2021-08-14 DIAGNOSIS — Z95810 Presence of automatic (implantable) cardiac defibrillator: Secondary | ICD-10-CM | POA: Diagnosis not present

## 2021-08-14 DIAGNOSIS — I472 Ventricular tachycardia, unspecified: Secondary | ICD-10-CM | POA: Diagnosis not present

## 2021-08-14 DIAGNOSIS — R55 Syncope and collapse: Secondary | ICD-10-CM | POA: Diagnosis not present

## 2021-08-14 DIAGNOSIS — J9811 Atelectasis: Secondary | ICD-10-CM | POA: Diagnosis not present

## 2021-08-14 DIAGNOSIS — I255 Ischemic cardiomyopathy: Secondary | ICD-10-CM | POA: Diagnosis not present

## 2021-08-15 DIAGNOSIS — I5022 Chronic systolic (congestive) heart failure: Secondary | ICD-10-CM | POA: Diagnosis not present

## 2021-08-15 DIAGNOSIS — I472 Ventricular tachycardia, unspecified: Secondary | ICD-10-CM | POA: Diagnosis not present

## 2021-08-15 DIAGNOSIS — Z95810 Presence of automatic (implantable) cardiac defibrillator: Secondary | ICD-10-CM | POA: Diagnosis not present

## 2021-08-15 DIAGNOSIS — R55 Syncope and collapse: Secondary | ICD-10-CM | POA: Diagnosis not present

## 2021-08-15 DIAGNOSIS — I9789 Other postprocedural complications and disorders of the circulatory system, not elsewhere classified: Secondary | ICD-10-CM | POA: Diagnosis not present

## 2021-08-15 DIAGNOSIS — I255 Ischemic cardiomyopathy: Secondary | ICD-10-CM | POA: Diagnosis not present

## 2021-08-16 DIAGNOSIS — R55 Syncope and collapse: Secondary | ICD-10-CM | POA: Diagnosis not present

## 2021-08-16 DIAGNOSIS — Z95810 Presence of automatic (implantable) cardiac defibrillator: Secondary | ICD-10-CM | POA: Diagnosis not present

## 2021-08-16 DIAGNOSIS — I4891 Unspecified atrial fibrillation: Secondary | ICD-10-CM | POA: Diagnosis not present

## 2021-08-16 DIAGNOSIS — I255 Ischemic cardiomyopathy: Secondary | ICD-10-CM | POA: Diagnosis not present

## 2021-08-16 DIAGNOSIS — I472 Ventricular tachycardia, unspecified: Secondary | ICD-10-CM | POA: Diagnosis not present

## 2021-08-16 DIAGNOSIS — I5022 Chronic systolic (congestive) heart failure: Secondary | ICD-10-CM | POA: Diagnosis not present

## 2021-08-17 DIAGNOSIS — N179 Acute kidney failure, unspecified: Secondary | ICD-10-CM | POA: Diagnosis present

## 2021-08-17 DIAGNOSIS — Z95810 Presence of automatic (implantable) cardiac defibrillator: Secondary | ICD-10-CM | POA: Diagnosis not present

## 2021-08-17 DIAGNOSIS — I9789 Other postprocedural complications and disorders of the circulatory system, not elsewhere classified: Secondary | ICD-10-CM | POA: Diagnosis not present

## 2021-08-17 DIAGNOSIS — J9811 Atelectasis: Secondary | ICD-10-CM | POA: Diagnosis not present

## 2021-08-17 DIAGNOSIS — I5022 Chronic systolic (congestive) heart failure: Secondary | ICD-10-CM | POA: Diagnosis not present

## 2021-08-17 DIAGNOSIS — R140 Abdominal distension (gaseous): Secondary | ICD-10-CM | POA: Diagnosis not present

## 2021-08-17 DIAGNOSIS — R55 Syncope and collapse: Secondary | ICD-10-CM | POA: Diagnosis not present

## 2021-08-17 DIAGNOSIS — I472 Ventricular tachycardia, unspecified: Secondary | ICD-10-CM | POA: Diagnosis not present

## 2021-08-17 DIAGNOSIS — I255 Ischemic cardiomyopathy: Secondary | ICD-10-CM | POA: Diagnosis not present

## 2021-08-17 DIAGNOSIS — Z9889 Other specified postprocedural states: Secondary | ICD-10-CM | POA: Diagnosis not present

## 2021-08-18 DIAGNOSIS — I9789 Other postprocedural complications and disorders of the circulatory system, not elsewhere classified: Secondary | ICD-10-CM | POA: Diagnosis not present

## 2021-08-19 DIAGNOSIS — I9789 Other postprocedural complications and disorders of the circulatory system, not elsewhere classified: Secondary | ICD-10-CM | POA: Diagnosis not present

## 2021-08-19 DIAGNOSIS — I4891 Unspecified atrial fibrillation: Secondary | ICD-10-CM | POA: Diagnosis not present

## 2021-08-21 ENCOUNTER — Observation Stay: Payer: PPO

## 2021-08-21 ENCOUNTER — Other Ambulatory Visit: Payer: Self-pay

## 2021-08-21 ENCOUNTER — Observation Stay
Admission: EM | Admit: 2021-08-21 | Discharge: 2021-08-22 | Payer: PPO | Attending: Internal Medicine | Admitting: Internal Medicine

## 2021-08-21 DIAGNOSIS — I130 Hypertensive heart and chronic kidney disease with heart failure and stage 1 through stage 4 chronic kidney disease, or unspecified chronic kidney disease: Secondary | ICD-10-CM | POA: Diagnosis not present

## 2021-08-21 DIAGNOSIS — J449 Chronic obstructive pulmonary disease, unspecified: Secondary | ICD-10-CM | POA: Diagnosis not present

## 2021-08-21 DIAGNOSIS — Z951 Presence of aortocoronary bypass graft: Secondary | ICD-10-CM

## 2021-08-21 DIAGNOSIS — S51812A Laceration without foreign body of left forearm, initial encounter: Secondary | ICD-10-CM | POA: Insufficient documentation

## 2021-08-21 DIAGNOSIS — Z7901 Long term (current) use of anticoagulants: Secondary | ICD-10-CM | POA: Diagnosis not present

## 2021-08-21 DIAGNOSIS — Z95810 Presence of automatic (implantable) cardiac defibrillator: Secondary | ICD-10-CM | POA: Diagnosis not present

## 2021-08-21 DIAGNOSIS — Z87891 Personal history of nicotine dependence: Secondary | ICD-10-CM | POA: Insufficient documentation

## 2021-08-21 DIAGNOSIS — S51011A Laceration without foreign body of right elbow, initial encounter: Secondary | ICD-10-CM | POA: Diagnosis not present

## 2021-08-21 DIAGNOSIS — Z7982 Long term (current) use of aspirin: Secondary | ICD-10-CM | POA: Insufficient documentation

## 2021-08-21 DIAGNOSIS — I10 Essential (primary) hypertension: Secondary | ICD-10-CM | POA: Diagnosis present

## 2021-08-21 DIAGNOSIS — S59901A Unspecified injury of right elbow, initial encounter: Secondary | ICD-10-CM | POA: Diagnosis present

## 2021-08-21 DIAGNOSIS — Z79899 Other long term (current) drug therapy: Secondary | ICD-10-CM | POA: Diagnosis not present

## 2021-08-21 DIAGNOSIS — I480 Paroxysmal atrial fibrillation: Secondary | ICD-10-CM | POA: Diagnosis not present

## 2021-08-21 DIAGNOSIS — N179 Acute kidney failure, unspecified: Secondary | ICD-10-CM

## 2021-08-21 DIAGNOSIS — R55 Syncope and collapse: Secondary | ICD-10-CM | POA: Diagnosis not present

## 2021-08-21 DIAGNOSIS — R58 Hemorrhage, not elsewhere classified: Secondary | ICD-10-CM | POA: Diagnosis not present

## 2021-08-21 DIAGNOSIS — I2510 Atherosclerotic heart disease of native coronary artery without angina pectoris: Secondary | ICD-10-CM | POA: Diagnosis not present

## 2021-08-21 DIAGNOSIS — I5022 Chronic systolic (congestive) heart failure: Secondary | ICD-10-CM | POA: Diagnosis not present

## 2021-08-21 DIAGNOSIS — Y92009 Unspecified place in unspecified non-institutional (private) residence as the place of occurrence of the external cause: Secondary | ICD-10-CM

## 2021-08-21 DIAGNOSIS — I472 Ventricular tachycardia, unspecified: Secondary | ICD-10-CM | POA: Diagnosis not present

## 2021-08-21 DIAGNOSIS — N189 Chronic kidney disease, unspecified: Secondary | ICD-10-CM | POA: Insufficient documentation

## 2021-08-21 DIAGNOSIS — W1839XA Other fall on same level, initial encounter: Secondary | ICD-10-CM | POA: Diagnosis not present

## 2021-08-21 DIAGNOSIS — Z20822 Contact with and (suspected) exposure to covid-19: Secondary | ICD-10-CM | POA: Diagnosis not present

## 2021-08-21 DIAGNOSIS — I25810 Atherosclerosis of coronary artery bypass graft(s) without angina pectoris: Secondary | ICD-10-CM | POA: Diagnosis not present

## 2021-08-21 DIAGNOSIS — I420 Dilated cardiomyopathy: Secondary | ICD-10-CM | POA: Diagnosis present

## 2021-08-21 DIAGNOSIS — W19XXXA Unspecified fall, initial encounter: Secondary | ICD-10-CM | POA: Diagnosis not present

## 2021-08-21 DIAGNOSIS — S51811A Laceration without foreign body of right forearm, initial encounter: Secondary | ICD-10-CM | POA: Diagnosis not present

## 2021-08-21 DIAGNOSIS — S0990XA Unspecified injury of head, initial encounter: Secondary | ICD-10-CM | POA: Diagnosis not present

## 2021-08-21 LAB — CBC
HCT: 34.9 % — ABNORMAL LOW (ref 39.0–52.0)
Hemoglobin: 11.4 g/dL — ABNORMAL LOW (ref 13.0–17.0)
MCH: 29.3 pg (ref 26.0–34.0)
MCHC: 32.7 g/dL (ref 30.0–36.0)
MCV: 89.7 fL (ref 80.0–100.0)
Platelets: 206 10*3/uL (ref 150–400)
RBC: 3.89 MIL/uL — ABNORMAL LOW (ref 4.22–5.81)
RDW: 15.6 % — ABNORMAL HIGH (ref 11.5–15.5)
WBC: 9.8 10*3/uL (ref 4.0–10.5)
nRBC: 0 % (ref 0.0–0.2)

## 2021-08-21 LAB — URINALYSIS, ROUTINE W REFLEX MICROSCOPIC
Bilirubin Urine: NEGATIVE
Glucose, UA: NEGATIVE mg/dL
Ketones, ur: NEGATIVE mg/dL
Nitrite: POSITIVE — AB
Specific Gravity, Urine: 1.01 (ref 1.005–1.030)
WBC, UA: >50 WBC/hpf — ABNORMAL HIGH (ref 0–5)
pH: 6 (ref 5.0–8.0)

## 2021-08-21 LAB — RESP PANEL BY RT-PCR (FLU A&B, COVID) ARPGX2
Influenza A by PCR: NEGATIVE
Influenza B by PCR: NEGATIVE
SARS Coronavirus 2 by RT PCR: NEGATIVE

## 2021-08-21 LAB — TROPONIN I (HIGH SENSITIVITY)
Troponin I (High Sensitivity): 42 ng/L — ABNORMAL HIGH (ref <18)
Troponin I (High Sensitivity): 43 ng/L — ABNORMAL HIGH (ref <18)

## 2021-08-21 LAB — BASIC METABOLIC PANEL
Anion gap: 9 (ref 5–15)
BUN: 48 mg/dL — ABNORMAL HIGH (ref 8–23)
CO2: 23 mmol/L (ref 22–32)
Calcium: 9 mg/dL (ref 8.9–10.3)
Chloride: 103 mmol/L (ref 98–111)
Creatinine, Ser: 2.03 mg/dL — ABNORMAL HIGH (ref 0.61–1.24)
GFR, Estimated: 34 mL/min — ABNORMAL LOW (ref >60)
Glucose, Bld: 136 mg/dL — ABNORMAL HIGH (ref 70–99)
Potassium: 4.3 mmol/L (ref 3.5–5.1)
Sodium: 135 mmol/L (ref 135–145)

## 2021-08-21 MED ORDER — AMIODARONE HCL 200 MG PO TABS: 400.0000 mg | ORAL_TABLET | Freq: Every day | ORAL | Status: DC

## 2021-08-21 MED ORDER — ONDANSETRON HCL 4 MG PO TABS: 4.0000 mg | ORAL_TABLET | Freq: Four times a day (QID) | ORAL | Status: DC | PRN

## 2021-08-21 MED ORDER — APIXABAN 5 MG PO TABS
5.0000 mg | ORAL_TABLET | Freq: Two times a day (BID) | ORAL | Status: DC
  Administered 2021-08-21 – 2021-08-22 (×3): 5 mg via ORAL
  Filled 2021-08-21 (×3): qty 1

## 2021-08-21 MED ORDER — ONDANSETRON HCL 4 MG/2ML IJ SOLN: 4.0000 mg | Freq: Four times a day (QID) | INTRAMUSCULAR | Status: DC | PRN

## 2021-08-21 MED ORDER — ASPIRIN EC 81 MG PO TBEC
81.0000 mg | DELAYED_RELEASE_TABLET | Freq: Every day | ORAL | Status: DC
  Administered 2021-08-22: 81 mg via ORAL
  Filled 2021-08-21: qty 1

## 2021-08-21 MED ORDER — DOUBLE ANTIBIOTIC 500-10000 UNIT/GM EX OINT
TOPICAL_OINTMENT | Freq: Two times a day (BID) | CUTANEOUS | Status: DC
  Filled 2021-08-21 (×2): qty 28.4

## 2021-08-21 MED ORDER — METOPROLOL SUCCINATE ER 50 MG PO TB24
50.0000 mg | ORAL_TABLET | Freq: Two times a day (BID) | ORAL | Status: DC
  Administered 2021-08-22 (×3): 50 mg via ORAL
  Filled 2021-08-21 (×3): qty 1

## 2021-08-21 MED ORDER — AMIODARONE HCL 200 MG PO TABS
400.0000 mg | ORAL_TABLET | Freq: Two times a day (BID) | ORAL | Status: DC
Start: 2021-08-21 — End: 2021-08-23
  Administered 2021-08-21 – 2021-08-22 (×3): 400 mg via ORAL
  Filled 2021-08-21 (×3): qty 2

## 2021-08-21 MED ORDER — OXYCODONE HCL 5 MG PO TABS: 5.0000 mg | ORAL_TABLET | ORAL | Status: DC | PRN

## 2021-08-21 MED ORDER — BACITRACIN ZINC 500 UNIT/GM EX OINT
TOPICAL_OINTMENT | CUTANEOUS | Status: AC
  Filled 2021-08-21: qty 0.9

## 2021-08-21 MED ORDER — ALBUTEROL SULFATE HFA 108 (90 BASE) MCG/ACT IN AERS: 2.0000 | INHALATION_SPRAY | Freq: Four times a day (QID) | RESPIRATORY_TRACT | Status: DC | PRN

## 2021-08-21 MED ORDER — ALBUTEROL SULFATE (2.5 MG/3ML) 0.083% IN NEBU: 2.5000 mg | INHALATION_SOLUTION | Freq: Four times a day (QID) | RESPIRATORY_TRACT | Status: DC | PRN

## 2021-08-21 MED ORDER — ROSUVASTATIN CALCIUM 10 MG PO TABS
40.0000 mg | ORAL_TABLET | Freq: Every day | ORAL | Status: DC
  Administered 2021-08-22: 40 mg via ORAL
  Filled 2021-08-21: qty 4

## 2021-08-21 MED ORDER — TIOTROPIUM BROMIDE MONOHYDRATE 18 MCG IN CAPS
18.0000 ug | ORAL_CAPSULE | Freq: Every day | RESPIRATORY_TRACT | Status: DC
  Administered 2021-08-22: 18 ug via RESPIRATORY_TRACT
  Filled 2021-08-21: qty 5

## 2021-08-21 MED ORDER — POLYETHYLENE GLYCOL 3350 17 G PO PACK: 17.0000 g | PACK | Freq: Every day | ORAL | Status: DC | PRN

## 2021-08-21 MED ORDER — TAMSULOSIN HCL 0.4 MG PO CAPS
0.4000 mg | ORAL_CAPSULE | Freq: Every day | ORAL | Status: DC
  Administered 2021-08-22: 0.4 mg via ORAL
  Filled 2021-08-21: qty 1

## 2021-08-21 MED ORDER — SODIUM CHLORIDE 0.9% FLUSH
3.0000 mL | Freq: Two times a day (BID) | INTRAVENOUS | Status: DC
  Administered 2021-08-22: 3 mL via INTRAVENOUS

## 2021-08-21 MED ORDER — LACTATED RINGERS IV BOLUS
500.0000 mL | Freq: Once | INTRAVENOUS | Status: AC
  Administered 2021-08-21: 500 mL via INTRAVENOUS

## 2021-08-21 MED ORDER — ACETAMINOPHEN 325 MG PO TABS
650.0000 mg | ORAL_TABLET | Freq: Four times a day (QID) | ORAL | Status: DC | PRN
  Administered 2021-08-21 – 2021-08-22 (×2): 650 mg via ORAL
  Filled 2021-08-21 (×2): qty 2

## 2021-08-21 MED ORDER — ACETAMINOPHEN 650 MG RE SUPP: 650.0000 mg | Freq: Four times a day (QID) | RECTAL | Status: DC | PRN

## 2021-08-21 MED ORDER — TRAZODONE HCL 50 MG PO TABS: 50.0000 mg | ORAL_TABLET | Freq: Every evening | ORAL | Status: DC | PRN

## 2021-08-21 NOTE — ED Triage Notes (Addendum)
BIB EMS patient reports he was sitting on edge of stool at the bar and had a syncopal episode.  Doesn't remember what happened.  Patient just had pacemaker and defib placed on Monday.  Patient skin tears to bilateral arms.  Patient currently taking eliquis. Patient also abrasion across abdomen

## 2021-08-21 NOTE — Consult Note (Signed)
[facility] Cardiology  CARDIOLOGY CONSULT NOTE  Patient ID: Manuel Bowen. MRN: [id number] DOB/AGE: September 17, 1945 76 y.o.  Admit date: 08/21/2021 Referring Physician Donna Naven Primary Physician Marina Goodell, MD Primary Cardiologist Jamse Mead Reason for Consultation VT, syncope  HPI:  Manuel Bowen is a 76 year old male with a history of CAD s/p CABG 04/2021, dilated cardiomyopathy ( EF 24%) s/p dual-chamber ICD on 08/16/2021, VT, atrial fibrillation who presents following an episode of syncope for which his device showed ventricular tachycardia as the etiology.  Notably, the patient was admitted to [facility] on 08/12/2021 after his LifeVest shocked him multiple times in the setting of syncope which was presumed to be due to ventricular tachycardia.  He ruled out for ACS, and had a cardiac MRI that showed subendocardial infarction of the LAD and RCA territory, approximately about 10% scar.  He was started on amiodarone load with a plan to complete 400 mg twice daily for 5 additional days after discharge on 08/19/2021.  He had a dual-chamber ICD placed on 08/16/2021.  Following his discharge he was doing well until today when he was walking from the fridge and had a sudden syncopal episode. He had no prodrome and woke up on the flow with a few minor injuries. He does not remember what happened.  Denies any chest pain or shortness of breath. Device interrogation shows ventricular tachycardia that was terminated with defibrillation.  Review of systems complete and found to be negative unless listed above     Past Medical History:  Diagnosis Date   Chronic kidney disease    stones   COPD (chronic obstructive pulmonary disease) (HCC)    Diverticulosis    History of kidney stones    Hypertension    Inguinal hernia    Shortness of breath dyspnea     Past Surgical History:  Procedure Laterality Date   CATARACT EXTRACTION W/PHACO Right 06/25/2015   Procedure: CATARACT  EXTRACTION PHACO AND INTRAOCULAR LENS PLACEMENT (IOC);  Surgeon: Lia Hopping, MD;  Location: [facility] ORS;  Service: Ophthalmology;  Laterality: Right;  [place]: 01:23.0 AP%: 13.0 CDE: 10.75 Lot # [id number]   CATARACT EXTRACTION W/PHACO Left 04/14/2016   Procedure: CATARACT EXTRACTION PHACO AND INTRAOCULAR LENS PLACEMENT (IOC);  Surgeon: Nevada Crane, MD;  Location: [facility] ORS;  Service: Ophthalmology;  Laterality: Left;  [place] 1.27 AP% 10.8 CDE 9.48 Fluid Pack lot # [id number]   COLONOSCOPY     COLONOSCOPY WITH PROPOFOL N/A 04/27/2020   Procedure: COLONOSCOPY WITH PROPOFOL;  Surgeon: Regis Bill, MD;  Location: [facility] ENDOSCOPY;  Service: Endoscopy;  Laterality: N/A;   EYE SURGERY     GAS/FLUID EXCHANGE Right 02/11/2015   Procedure: GAS/FLUID EXCHANGE;  Surgeon: Marcelene Butte, MD;  Location: [facility] ORS;  Service: Ophthalmology;  Laterality: Right;  24%  SF6   LEFT HEART CATH AND CORONARY ANGIOGRAPHY N/A 03/11/2021   Procedure: LEFT HEART CATH AND CORONARY ANGIOGRAPHY;  Surgeon: Marcina Millard, MD;  Location: [facility] INVASIVE CV LAB;  Service: Cardiovascular;  Laterality: N/A;   LITHOTRIPSY     MEMBRANE PEEL Right 02/11/2015   Procedure: MEMBRANE PEEL;  Surgeon: Marcelene Butte, MD;  Location: [facility] ORS;  Service: Ophthalmology;  Laterality: Right;   PARS PLANA VITRECTOMY W/ SCLERAL BUCKLE Right 02/11/2015   Procedure: PARS PLANA VITRECTOMY WITH LASER FOR MACULAR HOLE;  Surgeon: Marcelene Butte, MD;  Location: [facility] ORS;  Service: Ophthalmology;  Laterality: Right;  Power200 Duration200 Interval200 Shot count117 Total energy 4.61 joules    (Not in a  hospital admission)  Social History   Socioeconomic History   Marital status: Widowed    Spouse name: Not on file   Number of children: Not on file   Years of education: Not on file   Highest education level: Not on file  Occupational History   Not on file  Tobacco Use   Smoking status: Former    Packs/day: 1.25    Years: 52.00     Pack years: 65.00    Types: Cigarettes    Quit date: 04/20/2021    Years since quitting: 0.3   Smokeless tobacco: Former   Tobacco comments:    Quit after CABG    Substance and Sexual Activity   Alcohol use: Not Currently    Alcohol/week: 14.0 standard drinks    Types: 14 Standard drinks or equivalent per week   Drug use: Never   Sexual activity: Not on file  Other Topics Concern   Not on file  Social History Narrative   Not on file   Social Determinants of Health   Financial Resource Strain: Not on file  Food Insecurity: Not on file  Transportation Needs: Not on file  Physical Activity: Not on file  Stress: Not on file  Social Connections: Not on file  Intimate Partner Violence: Not on file    History reviewed. No pertinent family history.    Review of systems complete and found to be negative unless listed above      PHYSICAL EXAM  General: Well developed, well nourished, in no acute distress HEENT:  Normocephalic and atramatic Neck:  No JVD.  Lungs: Clear bilaterally to auscultation and percussion. Heart: HRRR . Normal S1 and S2 without gallops or murmurs. Left chest wall with some swelling and erythema over ICD site. Abdomen: Bowel sounds are positive, abdomen soft and non-tender  Msk:  Back normal, normal gait. Normal strength and tone for age. Extremities: Left arm is swollen. A few minor abraisons.  Neuro: Alert and oriented X 3. Psych:  Good affect, responds appropriately  Labs:   Lab Results  Component Value Date   WBC 9.8 08/21/2021   HGB 11.4 (L) 08/21/2021   HCT 34.9 (L) 08/21/2021   MCV 89.7 08/21/2021   PLT 206 08/21/2021    Recent Labs  Lab 08/21/21 1431  NA 135  K 4.3  CL 103  CO2 23  BUN 48*  CREATININE 2.03*  CALCIUM 9.0  GLUCOSE 136*   No results found for: CKTOTAL, CKMB, CKMBINDEX, TROPONINI No results found for: CHOL No results found for: HDL No results found for: LDLCALC No results found for: TRIG No results found for:  CHOLHDL No results found for: LDLDIRECT    Radiology: No results found.  EKG: Unable to view at present.  ASSESSMENT AND PLAN:  Adela GlimpseBernard Bowen is a 76 year old male with a history of CAD s/p CABG 04/2021, dilated cardiomyopathy ( EF 24%) s/p dual-chamber ICD on 08/16/2021, VT, atrial fibrillation who presents following an episode of syncope for which his device showed ventricular tachycardia as the etiology.  #Ischemic cardiomyopathy #Ventricular tachycardia Recently had ventricular tachycardia on 08/12/2021 while wearing a LifeVest and was started on an amiodarone load.  He had a dual[facility]chamber ICD [facility], was doing well until 08/21/2021 when he had syncope.  Device interrogation shows ventricular tachycardia which was terminated by defibrillation.  Recently had a cardiac MRI that showed scar in the LAD and RCA distributions. Denies any chest pain suggestive of an ischemic event. -  Continue amiodarone 400 mg twice daily x3 additional days.  Then 400 mg daily. -If he has recurrent VT while in the hospital recommend IV bolus plus infusion for additional loading. -Increase metoprolol XL 50 mg BID -Needs follow-up with electrophysiology.  If recurrent VT may benefit from transfer to [facility] for inpatient EP consultation. -Trend troponin until peak. -Monitor on telemetry  #CAD s/p CABG x4  CABG x 4 on 05/05/2021 by Dr. Epifania Gore at [facility] with LIMA- LAD, SVG-RPDA, sequential SVG-OM3/OM1, with left atrial appendage ligation.  -Continue aspirin 81 mg -Metoprolol  -Continue Crestor 5 mg -Trend troponin until peak as above. -Does not appear to be a clear indication for repeat ischemic evaluation at this time.  Likely scar mediated VT.  #Atrial fibrillation He is status post left atrial appendage ligation during CABG. -Continue Eliquis 5 mg twice daily -Continue amiodarone and beta-blocker as above.  Signed: Armando Reichert MD 08/21/2021, 4:29 PM

## 2021-08-21 NOTE — ED Notes (Addendum)
Patient has a [employer] Dual ICD in place. Interrogation complete awaiting results

## 2021-08-21 NOTE — ED Notes (Signed)
Informed RN bed assigned 

## 2021-08-21 NOTE — H&P (Signed)
[facility] History and Physical  Manuel Bowen. [id number] DOB: November 06, 1945 DOA: 08/21/2021  Referring physician: Dr. Cheri Fowler PCP: Sofie Hartigan, MD   Chief Complaint: syncope  HPI: Manuel Bowen. is a 76 y.o. male with hx of CAD status post four-vessel CABG, chronic systolic CHF, dilated cardiomyopathy, ventricular tachycardia status post dual-chamber ICD placement on 08/16/2021, who presents after an episode of syncope.  Recent history notable for admission to [facility] on January 26 after LifeVest delivered multiple shocks, he was found to have ventricular tachycardia, had a dual-chamber ICD placed during admission, and was started on amiodarone.  Today patient reports to me that he was standing at a counter at his partners home when without warning he suddenly lost consciousness.  He is unsure how long he was unconscious for.  This happened several hours earlier.  He woke up with his legs crumpled under him and his head resting on his right arm, but he is unsure if he hit his head.  Currently he feels mostly fine except for some intermittent lightheadedness.  Denies chest pain, shortness of breath, palpitations.  In the ED initial vital signs were unremarkable.  Lab work-up showed a BMP notable for creatinine of 2 (baseline is around 1.3), CBC at his baseline, and troponin elevated at 42.  Cardiology was consulted and interrogated his pacemaker which showed he suffered an episode of ventricular tachycardia that was terminated with defibrillation.  He was admitted for observation.  Review of Systems:  Pertinent positives and negative per HPI, all others reviewed and negative  Past Medical History:  Diagnosis Date   Chronic kidney disease    stones   COPD (chronic obstructive pulmonary disease) ([place])    Diverticulosis    History of kidney stones    Hypertension    Inguinal hernia    Shortness of breath dyspnea    Past Surgical History:  Procedure  Laterality Date   CATARACT EXTRACTION W/PHACO Right 06/25/2015   Procedure: CATARACT EXTRACTION PHACO AND INTRAOCULAR LENS PLACEMENT ([place]);  Surgeon: Lyla Glassing, MD;  Location: [facility] ORS;  Service: Ophthalmology;  Laterality: Right;  [place]: 01:23.0 AP%: 13.0 CDE: 10.75 Lot # [id number]   CATARACT EXTRACTION W/PHACO Left 04/14/2016   Procedure: CATARACT EXTRACTION PHACO AND INTRAOCULAR LENS PLACEMENT ([place]);  Surgeon: Eulogio Bear, MD;  Location: [facility] ORS;  Service: Ophthalmology;  Laterality: Left;  [place] 1.27 AP% 10.8 CDE 9.48 Fluid Pack lot # [id number]   COLONOSCOPY     COLONOSCOPY WITH PROPOFOL N/A 04/27/2020   Procedure: COLONOSCOPY WITH PROPOFOL;  Surgeon: Lesly Rubenstein, MD;  Location: [facility] ENDOSCOPY;  Service: Endoscopy;  Laterality: N/A;   EYE SURGERY     GAS/FLUID EXCHANGE Right 02/11/2015   Procedure: GAS/FLUID EXCHANGE;  Surgeon: Milus Height, MD;  Location: [facility] ORS;  Service: Ophthalmology;  Laterality: Right;  24%  SF6   LEFT HEART CATH AND CORONARY ANGIOGRAPHY N/A 03/11/2021   Procedure: LEFT HEART CATH AND CORONARY ANGIOGRAPHY;  Surgeon: Isaias Cowman, MD;  Location: [place] CV LAB;  Service: Cardiovascular;  Laterality: N/A;   LITHOTRIPSY     MEMBRANE PEEL Right 02/11/2015   Procedure: MEMBRANE PEEL;  Surgeon: Milus Height, MD;  Location: [facility] ORS;  Service: Ophthalmology;  Laterality: Right;   PARS PLANA VITRECTOMY W/ SCLERAL BUCKLE Right 02/11/2015   Procedure: PARS PLANA VITRECTOMY WITH LASER FOR MACULAR HOLE;  Surgeon: Milus Height, MD;  Location: [facility] ORS;  Service: Ophthalmology;  Laterality: Right;  Power200 Duration200 Interval200 Shot count117 Total  energy 4.61 joules   Social History:  reports that he quit smoking about 4 months ago. His smoking use included cigarettes. He has a 65.00 pack-year smoking history. He has quit using smokeless tobacco. He reports that he does not currently use alcohol after a past usage of about 14.0 standard  drinks per week. He reports that he does not use drugs.  No Known Allergies  History reviewed. No pertinent family history.   Prior to Admission medications   Medication Sig Start Date End Date Taking? Authorizing Provider  amiodarone (PACERONE) 400 MG tablet Take 400 mg by mouth in the morning and at bedtime. 08/19/21  Yes [provider]  aspirin 81 MG tablet Take 81 mg by mouth daily.   Yes [provider]  ELIQUIS 5 MG TABS tablet Take 5 mg by mouth 2 (two) times daily. 07/03/21  Yes [provider]  furosemide (LASIX) 20 MG tablet Take 20 mg by mouth daily. 06/07/21  Yes [provider]  metoprolol succinate (TOPROL-XL) 25 MG 24 hr tablet Take 25 mg by mouth daily. 01/13/21  Yes [provider]  Multiple Vitamin (MULTI-VITAMINS) TABS Take 1 tablet by mouth daily.   Yes [provider]  rosuvastatin (CRESTOR) 5 MG tablet Take 5 mg by mouth daily.   Yes [provider]  spironolactone (ALDACTONE) 25 MG tablet Take 12.5 mg by mouth daily. 05/25/21  Yes [provider]  tamsulosin (FLOMAX) 0.4 MG CAPS capsule Take 0.4 mg by mouth daily. 05/05/21  Yes [provider]  tiotropium (SPIRIVA) 18 MCG inhalation capsule Place 18 mcg into inhaler and inhale daily at 12 noon.   Yes [provider]  albuterol (VENTOLIN HFA) 108 (90 Base) MCG/ACT inhaler Inhale 2 puffs into the lungs every 6 (six) hours as needed for wheezing or shortness of breath.    [provider]  sildenafil (VIAGRA) 50 MG tablet Take 50 mg by mouth daily as needed for erectile dysfunction. 02/15/21   [provider]   Physical Exam: Vitals:   08/21/21 1416 08/21/21 1418 08/21/21 1420  BP:   134/67  Pulse:   71  Resp:   18  Temp:   97.8 F (36.6 C)  SpO2: 97%  100%  Weight:  67.1 kg   Height:  5\' 10"  (1.778 m)     Wt Readings from Last 3 Encounters:  08/21/21 67.1 kg  03/11/21 71.7 kg  04/27/20 71.7 kg      General:  Appears calm and comfortable Eyes: PERRL, normal lids, irises & conjunctiva ENT: grossly normal hearing, lips & tongue Neck: no masses Cardiovascular: RRR, no m/r/g. No LE edema. Telemetry: SR, no arrhythmias  Respiratory: CTA bilaterally, no w/r/r. Normal respiratory effort. Abdomen: soft, ntnd Skin: some swelling and echymoses over site of recent ICD placement Musculoskeletal: grossly normal tone BUE/BLE Psychiatric: grossly normal mood and affect, speech fluent and appropriate Neurologic: grossly non-focal.          Labs on Admission:  Basic Metabolic Panel: Recent Labs  Lab 08/21/21 1431  NA 135  K 4.3  CL 103  CO2 23  GLUCOSE 136*  BUN 48*  CREATININE 2.03*  CALCIUM 9.0   Liver Function Tests: No results for input(s): AST, ALT, ALKPHOS, BILITOT, PROT, ALBUMIN in the last 168 hours. No results for input(s): LIPASE, AMYLASE in the last 168 hours. No results for input(s): AMMONIA in the last 168 hours. CBC: Recent Labs  Lab 08/21/21 1431  WBC 9.8  HGB 11.4*  HCT 34.9*  MCV 89.7  PLT 206   Cardiac Enzymes: No results for input(s): CKTOTAL, CKMB, CKMBINDEX, TROPONINI in the last 168 hours.  BNP (last 3 results) No results for input(s): BNP in the last 8760 hours.  ProBNP (last 3 results) No results for input(s): PROBNP in the last 8760 hours.  CBG: No results for input(s): GLUCAP in the last 168 hours.  Radiological Exams on Admission: No results found.  EKG: Not obtained  Assessment/Plan Principal Problem:   VT (ventricular tachycardia) Active Problems:   Benign hypertension   AKI (acute kidney injury) (HCC)   Chronic systolic CHF (congestive heart failure), NYHA class 2 (HCC)   Dilated cardiomyopathy (HCC)   S/P CABG x 4   Manuel Bowen. is a 76 y.o. male with hx of CAD status post four-vessel CABG, chronic systolic CHF, dilated cardiomyopathy, ventricular tachycardia status post dual-chamber ICD placement on 08/16/2021,  who presents after an episode of syncope and was found to have an episode of ventricular tachycardia that was terminated by defibrillation.  #Vtach s/p dual chamber ICD placement #CAD s/p 4v CABG #Syncope Positive syncope secondary to V. tach terminated by defibrillation.  Appreciate cardiology recommendations. - Continue amiodarone 400 mg twice daily for an additional 3 days followed by 400 mg daily - If recurrent VT cardiology recommend IV bolus plus infusion for additional loading - Increase metoprolol XL to 50 mg twice daily - Consider transfer to [facility] if recurrent VT for inpatient EP consultation - Trend troponin to peak - Telemetry - Continue aspirin, rosuvastatin  #Fall Unwitnessed, does not give a good history for head trauma or have any obvious wounds but given he is on anticoagulation will obtain CT head. - Follow-up results  #AKI Baseline creatinine around 1.3, currently 2, will give gentle 500 cc LR bolus and reassess BMP in the a.m. - 500 cc LR bolus - Trend BMP - Hold furosemide, spironolactone  #Chronic medical problems BPH-continue Flomax COPD-continue to Tropium  Code Status: Full Code, confirmed DVT Prophylaxis: on full dose anticoagulation Family Communication: Partner updated at bedside Disposition Plan: Observation, Cardiac Tele   Time spent: 50 min  Manuel Flock MD/[name]  Note:  This document was prepared using Systems analyst and may include unintentional dictation errors.

## 2021-08-21 NOTE — ED Provider Notes (Addendum)
[facility] Provider Note    Event Date/Time   First MD Initiated Contact with Patient 08/21/21 1434     (approximate)   History   Fall   HPI  Manuel Needs. is a 76 y.o. male with a significant past cardiac history including cardiomyopathy, CAD, recurrent ventricular tachycardia and status post pacemaker/defibrillator placement discharged 2 days prior to arrival who presents after a syncopal episode that occurred just prior to arrival.  Patient states that he was in his normal state of health eating at a barstool when he lost consciousness and woke up on the floor.     Physical Exam   Triage Vital Signs: ED Triage Vitals  Enc Vitals Group     BP 08/21/21 1420 134/67     Pulse Rate 08/21/21 1420 71     Resp 08/21/21 1420 18     Temp 08/21/21 1420 97.8 F (36.6 C)     Temp src --      SpO2 08/21/21 1416 97 %     Weight 08/21/21 1418 148 lb (67.1 kg)     Height 08/21/21 1418 5\' 10"  (1.778 m)     Head Circumference --      Peak Flow --      Pain Score 08/21/21 1417 0     Pain Loc --      Pain Edu? --      Excl. in GC? --     Most recent vital signs: Vitals:   08/21/21 1416 08/21/21 1420  BP:  134/67  Pulse:  71  Resp:  18  Temp:  97.8 F (36.6 C)  SpO2: 97% 100%    General: Awake, no distress.  CV:  Good peripheral perfusion.  Resp:  Normal effort.  Abd:  No distention.  Other:  2 superficial skin tears to bilateral forearms with a 3 cm stellate skin tear to the left anterior forearm as well as a 5 cm irregular skin tear to right elbow.  Dressing in place from recent pacemaker placement to the left upper chest wall   ED Results / Procedures / Treatments   Labs (all labs ordered are listed, but only abnormal results are displayed) Labs Reviewed  BASIC METABOLIC PANEL - Abnormal; Notable for the following components:      Result Value   Glucose, Bld 136 (*)    BUN 48 (*)    Creatinine, Ser 2.03 (*)    GFR, Estimated 34  (*)    All other components within normal limits  CBC - Abnormal; Notable for the following components:   RBC 3.89 (*)    Hemoglobin 11.4 (*)    HCT 34.9 (*)    RDW 15.6 (*)    All other components within normal limits  TROPONIN I (HIGH SENSITIVITY) - Abnormal; Notable for the following components:   Troponin I (High Sensitivity) 42 (*)    All other components within normal limits  URINALYSIS, ROUTINE W REFLEX MICROSCOPIC  TROPONIN I (HIGH SENSITIVITY)     EKG ED ECG REPORT I, 10/19/21, the attending physician, personally viewed and interpreted this ECG.  Date: 08/21/2021 EKG Time: 1422 Rate: 71 Rhythm: Intermittently paced sinus rhythm QRS Axis: normal Intervals: normal ST/T Wave abnormalities: normal Narrative Interpretation: Intermittently paced sinus rhythm.  No evidence of acute ischemia  PROCEDURES:  Critical Care performed: No  .1-3 Lead EKG Interpretation Performed by: [id number], MD Authorized by: Merwyn Katos, MD  Interpretation: abnormal     ECG rate:  70   ECG rate assessment: normal     Rhythm: paced     Ectopy: none     Conduction: normal     MEDICATIONS ORDERED IN ED: Medications  polymixin-bacitracin (POLYSPORIN) ointment (has no administration in time range)  amiodarone (PACERONE) tablet 400 mg (has no administration in time range)    Followed by  amiodarone (PACERONE) tablet 400 mg (has no administration in time range)  metoprolol succinate (TOPROL-XL) 24 hr tablet 50 mg (has no administration in time range)  aspirin tablet 81 mg (has no administration in time range)  rosuvastatin (CRESTOR) tablet 40 mg (has no administration in time range)  tamsulosin (FLOMAX) capsule 0.4 mg (has no administration in time range)  apixaban (ELIQUIS) tablet 5 mg (has no administration in time range)  tiotropium (SPIRIVA) inhalation capsule ([facility] use ONLY) 18 mcg (has no administration in time range)  albuterol (VENTOLIN HFA) 108 (90 Base)  MCG/ACT inhaler 2 puff (has no administration in time range)  bacitracin 500 UNIT/GM ointment (  Given by Other 08/21/21 1714)     IMPRESSION / MDM / ASSESSMENT AND PLAN / ED COURSE  I reviewed the triage vital signs and the nursing notes.                              Differential diagnosis includes, but is not limited to, arrhythmia, orthostatic syncope, vasovagal syncope, ACS, heart failure, PE  The patient is on the cardiac monitor to evaluate for evidence of arrhythmia and/or significant heart rate changes.  Patient is a 76 year old male with a significant past cardiac history who presents after a syncopal episode at home at rest.  Interrogation of patient's new pacer shows evidence of ventricular tachycardia that was shocked 3 times prior to return of sinus rhythm.  Patient does show mild troponin elevation of 42.  Patient's BMP does show slightly worsened creatinine from baseline at 2.03 up from 1.3 2 days ago and may be secondary to dehydration.  Upon further review of patient's discharge instructions from 08/19/2021, patient did have changes to his antiarrhythmic medications including amiodarone for 100 mg twice daily and metoprolol succinate 50 mg every morning and 25 mg every afternoon.  Patient states that he has been taking his medications on time and as prescribed.  Patient denies any subsequent episodes of syncope.  Patient denies any current chest pain. Consults: Cardiology-spoke to Dr. Beatrix Fetters who recommends loading patient with amiodarone at this time as well as increasing to metoprolol succinate 50 mg twice daily.  Also recommends admission for monitoring for any further arrhythmias Hospitalist-spoke to Dr. Crissie Reese who agrees to accept this patient onto his service for further evaluation and management  Dispo: Admit to medicine       FINAL CLINICAL IMPRESSION(S) / ED DIAGNOSES   Final diagnoses:  Syncope, unspecified syncope type  Skin tear of elbow without complication,  right, initial encounter  Skin tear of forearm without complication, left, initial encounter     Rx / DC Orders   ED Discharge Orders     None        Note:  This document was prepared using Dragon voice recognition software and may include unintentional dictation errors.   Merwyn Katos, MD 08/21/21 1757    Merwyn Katos, MD 08/21/21 [phone]

## 2021-08-22 DIAGNOSIS — I255 Ischemic cardiomyopathy: Secondary | ICD-10-CM | POA: Diagnosis not present

## 2021-08-22 DIAGNOSIS — I130 Hypertensive heart and chronic kidney disease with heart failure and stage 1 through stage 4 chronic kidney disease, or unspecified chronic kidney disease: Secondary | ICD-10-CM | POA: Diagnosis not present

## 2021-08-22 DIAGNOSIS — I472 Ventricular tachycardia, unspecified: Secondary | ICD-10-CM | POA: Diagnosis not present

## 2021-08-22 DIAGNOSIS — I5022 Chronic systolic (congestive) heart failure: Secondary | ICD-10-CM | POA: Diagnosis not present

## 2021-08-22 DIAGNOSIS — Z95810 Presence of automatic (implantable) cardiac defibrillator: Secondary | ICD-10-CM | POA: Diagnosis not present

## 2021-08-22 DIAGNOSIS — R55 Syncope and collapse: Secondary | ICD-10-CM | POA: Diagnosis not present

## 2021-08-22 DIAGNOSIS — N179 Acute kidney failure, unspecified: Secondary | ICD-10-CM | POA: Diagnosis not present

## 2021-08-22 DIAGNOSIS — I25810 Atherosclerosis of coronary artery bypass graft(s) without angina pectoris: Secondary | ICD-10-CM | POA: Diagnosis not present

## 2021-08-22 DIAGNOSIS — N183 Chronic kidney disease, stage 3 unspecified: Secondary | ICD-10-CM | POA: Diagnosis not present

## 2021-08-22 DIAGNOSIS — Z951 Presence of aortocoronary bypass graft: Secondary | ICD-10-CM | POA: Diagnosis not present

## 2021-08-22 LAB — CBC
HCT: 31.2 % — ABNORMAL LOW (ref 39.0–52.0)
Hemoglobin: 10.3 g/dL — ABNORMAL LOW (ref 13.0–17.0)
MCH: 28.5 pg (ref 26.0–34.0)
MCHC: 33 g/dL (ref 30.0–36.0)
MCV: 86.4 fL (ref 80.0–100.0)
Platelets: 202 10*3/uL (ref 150–400)
RBC: 3.61 MIL/uL — ABNORMAL LOW (ref 4.22–5.81)
RDW: 15.6 % — ABNORMAL HIGH (ref 11.5–15.5)
WBC: 8.9 10*3/uL (ref 4.0–10.5)
nRBC: 0 % (ref 0.0–0.2)

## 2021-08-22 LAB — BASIC METABOLIC PANEL
Anion gap: 6 (ref 5–15)
BUN: 37 mg/dL — ABNORMAL HIGH (ref 8–23)
CO2: 24 mmol/L (ref 22–32)
Calcium: 8.8 mg/dL — ABNORMAL LOW (ref 8.9–10.3)
Chloride: 105 mmol/L (ref 98–111)
Creatinine, Ser: 1.58 mg/dL — ABNORMAL HIGH (ref 0.61–1.24)
GFR, Estimated: 45 mL/min — ABNORMAL LOW (ref >60)
Glucose, Bld: 102 mg/dL — ABNORMAL HIGH (ref 70–99)
Potassium: 4.6 mmol/L (ref 3.5–5.1)
Sodium: 135 mmol/L (ref 135–145)

## 2021-08-22 MED ORDER — DOUBLE ANTIBIOTIC 500-10000 UNIT/GM EX OINT: 1.0000 "application " | TOPICAL_OINTMENT | Freq: Two times a day (BID) | CUTANEOUS | 0 refills | Status: AC

## 2021-08-22 NOTE — Discharge Summary (Addendum)
Discharge Summary  Manuel Bowen. ZTI:[id number] DOB: Dec 22, 1945  PCP: Marina Goodell, MD  Admit date: 08/21/2021 Discharge date: 08/22/2021  Time spent: 35 minutes.  Recommendations for Outpatient Follow-up:  Patient has been transferred to [facility] for inpatient EP evaluation.  Patient has been accepted by Dr. Cyndy Freeze.  Discharge Diagnoses:  Active Hospital Problems   Diagnosis Date Noted   VT (ventricular tachycardia) 08/21/2021   AKI (acute kidney injury) (HCC) 08/17/2021   S/P CABG x 4 04/21/2021   Chronic systolic CHF (congestive heart failure), NYHA class 2 (HCC) 01/12/2021   Dilated cardiomyopathy (HCC) 01/12/2021   Benign hypertension 05/15/2014    Resolved Hospital Problems  No resolved problems to display.    Discharge Condition: Stable  Diet recommendation: Heart healthy diet.  Vitals:   08/22/21 0459 08/22/21 0725  BP: (!) 95/51 106/66  Pulse: 70 70  Resp: 16 17  Temp: 98.3 F (36.8 C) 98 F (36.7 C)  SpO2: 96% 95%    History of present illness:  Manuel Bowen. is a 76 y.o. male with hx of CAD status post four-vessel CABG, chronic systolic CHF, dilated cardiomyopathy, paroxysmal A. fib on Eliquis, ventricular tachycardia status post dual-chamber ICD placement on 08/16/2021, who presents after an episode of unwitnessed syncope, thought cardiogenic.  Recent history notable for admission to [facility] on January 26 after [employer] delivered multiple shocks, he was found to have ventricular tachycardia, had a dual-chamber ICD placed during admission, and was started on amiodarone.   In the ED, Cardiology was consulted and interrogated his pacemaker which showed he suffered an episode of ventricular tachycardia that was terminated with defibrillation.  He was admitted for observation.  Seen by cardiology with plan to transfer to [facility] for inpatient EP evaluation.   08/22/21: Patient was seen and examined at his bedside.  He denies any anginal  symptoms.  No palpitations.  Left upper extremity with mild edema and scrape from his fall, no notable erythema or tenderness with palpation to suggest cellulitis at this time.  Would closely monitor for any signs of cellulitis.  Hospital Course:  Principal Problem:   VT (ventricular tachycardia) Active Problems:   Benign hypertension   AKI (acute kidney injury) (HCC)   Chronic systolic CHF (congestive heart failure), NYHA class 2 (HCC)   Dilated cardiomyopathy (HCC)   S/P CABG x 4  Manuel Bowen. is a 76 y.o. male with hx of CAD status post four-vessel CABG, chronic systolic CHF, dilated cardiomyopathy LVEF 25-35%, ventricular tachycardia status post dual-chamber ICD placement on 08/16/2021, who presents after an syncopal event and was found to have an episode of ventricular tachycardia that was terminated by defibrillation.   #Vtach s/p dual chamber ICD placement on 08/16/21 at [facility] #CAD s/p 4v CABG #Syncope likely cardiogenic Positive syncope secondary to V. tach terminated by defibrillation.  Appreciate cardiology recommendations. - Continue amiodarone 400 mg twice daily for an additional 3 days followed by 400 mg daily - Increase metoprolol XL to 50 mg twice daily - Cardiology will transfer to [facility] for inpatient EP evaluation - Troponin peaked at 43 - Telemetry monitoring - Continue aspirin, rosuvastatin   #Unwitnessed Fall from syncope Unwitnessed, does not give a good history for head trauma  CT head negative for acute intracranial abnormalities   #Improving AKI, suspect prerenal 2/2 to dehydration Baseline creatinine around 1.3 Presented with creatinine 2 Received IV fluid with improvement of creatinine  Diuretics on hold and off IV fluid Continue to  hold PTA po furosemide and spironolactone   #Chronic medical problems BPH-continue Flomax COPD-continue Spiriva, maintain O2 sat>90%  Paroxysmal A. fib on Eliquis Continue Eliquis for CVA prevention Continue  metoprolol for rate control Continue amiodarone as recommended by cardiology   Mild LUE edema with scrapes from fall, POA No surrounding erythema noted but mild scrapes from fall Afebrile, no leukocytosis, non toxic appearing Monitor for signs of cellulitis, tenderness, warmth, edema Not on systemic antibiotics due to no obvious signs of cellulitis Continue Polysporin ointment PRN   Critical care time: 65 minutes.   Code Status: Full Code, confirmed DVT Prophylaxis: on full dose anticoagulation Family Communication: Partner updated at bedside Disposition Plan: Observation, Cardiac Tele        Code Status: Full code   Family Communication: Family member at bedside   Disposition Plan: Plan to transfer to [facility] by cardiology     Consultants: Cardiology   Procedures: None   Antimicrobials: None   DVT prophylaxis: Eliquis     Discharge Exam: BP 106/66 (BP Location: Right Arm)    Pulse 70    Temp 98 F (36.7 C)    Resp 17    Ht 5\' 10"  (1.778 m)    Wt 64.9 kg    SpO2 95%    BMI 20.52 kg/m  General: 76 y.o. year-old male well developed well nourished in no acute distress.  Alert and oriented x3. Cardiovascular: Regular rate and rhythm with no rubs or gallops.  No thyromegaly or JVD noted.   Respiratory: Clear to auscultation with no wheezes or rales. Good inspiratory effort. Abdomen: Soft nontender nondistended with normal bowel sounds x4 quadrants. Musculoskeletal: No lower extremity edema. 2/4 pulses in all 4 extremities. Skin: Left upper extremity scrapes from fall, POA Psychiatry: Mood is appropriate for condition and setting  Discharge Instructions You were cared for by a hospitalist during your hospital stay. If you have any questions about your discharge medications or the care you received while you were in the hospital after you are discharged, you can call the unit and asked to speak with the hospitalist on call if the hospitalist that took care of you is not  available. Once you are discharged, your primary care physician will handle any further medical issues. Please note that NO REFILLS for any discharge medications will be authorized once you are discharged, as it is imperative that you return to your primary care physician (or establish a relationship with a primary care physician if you do not have one) for your aftercare needs so that they can reassess your need for medications and monitor your lab values.   Allergies as of 08/22/2021   No Known Allergies      Medication List     STOP taking these medications    albuterol 108 (90 Base) MCG/ACT inhaler Commonly known as: VENTOLIN HFA   furosemide 20 MG tablet Commonly known as: LASIX   sildenafil 50 MG tablet Commonly known as: VIAGRA   spironolactone 25 MG tablet Commonly known as: ALDACTONE       TAKE these medications    amiodarone 400 MG tablet Commonly known as: PACERONE Take 400 mg by mouth in the morning and at bedtime.   aspirin 81 MG tablet Take 81 mg by mouth daily.   Eliquis 5 MG Tabs tablet Generic drug: apixaban Take 5 mg by mouth 2 (two) times daily.   metoprolol succinate 25 MG 24 hr tablet Commonly known as: TOPROL-XL Take 25 mg by mouth daily.  Multi-Vitamins Tabs Take 1 tablet by mouth daily.   polymixin-bacitracin 500-10000 UNIT/GM Oint ointment Apply 1 application topically 2 (two) times daily.   rosuvastatin 5 MG tablet Commonly known as: CRESTOR Take 5 mg by mouth daily.   tamsulosin 0.4 MG Caps capsule Commonly known as: FLOMAX Take 0.4 mg by mouth daily.   tiotropium 18 MCG inhalation capsule Commonly known as: SPIRIVA Place 18 mcg into inhaler and inhale daily at 12 noon.       No Known Allergies  Follow-up Information     Feldpausch, Madaline Guthrieale E, MD Follow up today.   Specialty: Family Medicine Contact information: [address] [place] [id number] [phone]                  The results of significant  diagnostics from this hospitalization (including imaging, microbiology, ancillary and laboratory) are listed below for reference.    Significant Diagnostic Studies: CT Head Wo Contrast  Result Date: 08/21/2021 CLINICAL DATA:  Fall, head trauma. EXAM: CT HEAD WITHOUT CONTRAST TECHNIQUE: Contiguous axial images were obtained from the base of the skull through the vertex without intravenous contrast. RADIATION DOSE REDUCTION: This exam was performed according to the departmental dose-optimization program which includes automated exposure control, adjustment of the mA and/or kV according to patient size and/or use of iterative reconstruction technique. COMPARISON:  None. FINDINGS: Brain: Generalized age related parenchymal volume loss with commensurate dilatation of the ventricles and sulci. Minimal chronic small vessel ischemic change within the deep periventricular white matter regions. No mass, hemorrhage, edema or other evidence of acute parenchymal abnormality. No extra-axial hemorrhage. Vascular: Chronic calcified atherosclerotic changes of the large vessels at the skull base. No unexpected hyperdense vessel. Skull: Normal. Negative for fracture or focal lesion. Sinuses/Orbits: No acute finding. Other: None. IMPRESSION: No acute findings. No intracranial mass, hemorrhage or edema. No skull fracture. Electronically Signed   By: Bary RichardStan  Maynard M.D.   On: 08/21/2021 18:58    Microbiology: Recent Results (from the past 240 hour(s))  Resp Panel by RT-PCR (Flu A&B, Covid) Nasopharyngeal Swab     Status: None   Collection Time: 08/21/21  6:45 PM   Specimen: Nasopharyngeal Swab; Nasopharyngeal(NP) swabs in vial transport medium  Result Value Ref Range Status   SARS Coronavirus 2 by RT PCR NEGATIVE NEGATIVE Final    Comment: (NOTE) SARS-CoV-2 target nucleic acids are NOT DETECTED.  The SARS-CoV-2 RNA is generally detectable in upper respiratory specimens during the acute phase of infection. The  lowest concentration of SARS-CoV-2 viral copies this assay can detect is 138 copies/mL. A negative result does not preclude SARS-Cov-2 infection and should not be used as the sole basis for treatment or other patient management decisions. A negative result may occur with  improper specimen collection/handling, submission of specimen other than nasopharyngeal swab, presence of viral mutation(s) within the areas targeted by this assay, and inadequate number of viral copies(<138 copies/mL). A negative result must be combined with clinical observations, patient history, and epidemiological information. The expected result is Negative.  Fact Sheet for Patients:  BloggerCourse.comhttps://www.fda.gov/media/152166/download  Fact Sheet for Healthcare Providers:  SeriousBroker.ithttps://www.fda.gov/media/152162/download  This test is no t yet approved or cleared by the [place] FDA and  has been authorized for detection and/or diagnosis of SARS-CoV-2 by FDA under an Emergency Use Authorization (EUA). This EUA will remain  in effect (meaning this test can be used) for the duration of the COVID-19 declaration under Section 564(b)(1) of the Act, 21 U.S.C.section 360bbb-3(b)(1), unless the authorization  is terminated  or revoked sooner.       Influenza A by PCR NEGATIVE NEGATIVE Final   Influenza B by PCR NEGATIVE NEGATIVE Final    Comment: (NOTE) The Xpert Xpress SARS-CoV-2/FLU/RSV plus assay is intended as an aid in the diagnosis of influenza from Nasopharyngeal swab specimens and should not be used as a sole basis for treatment. Nasal washings and aspirates are unacceptable for Xpert Xpress SARS-CoV-2/FLU/RSV testing.  Fact Sheet for Patients: BloggerCourse.com  Fact Sheet for Healthcare Providers: SeriousBroker.it  This test is not yet approved or cleared by the [place] FDA and has been authorized for detection and/or diagnosis of SARS-CoV-2 by FDA under  an Emergency Use Authorization (EUA). This EUA will remain in effect (meaning this test can be used) for the duration of the COVID-19 declaration under Section 564(b)(1) of the Act, 21 U.S.C. section 360bbb-3(b)(1), unless the authorization is terminated or revoked.  Performed at [facility], [address] [place], [place] [address]      Labs: Basic Metabolic Panel: Recent Labs  Lab 08/21/21 1431 08/22/21 0528  NA 135 135  K 4.3 4.6  CL 103 105  CO2 23 24  GLUCOSE 136* 102*  BUN 48* 37*  CREATININE 2.03* 1.58*  CALCIUM 9.0 8.8*   Liver Function Tests: No results for input(s): AST, ALT, ALKPHOS, BILITOT, PROT, ALBUMIN in the last 168 hours. No results for input(s): LIPASE, AMYLASE in the last 168 hours. No results for input(s): AMMONIA in the last 168 hours. CBC: Recent Labs  Lab 08/21/21 1431 08/22/21 0528  WBC 9.8 8.9  HGB 11.4* 10.3*  HCT 34.9* 31.2*  MCV 89.7 86.4  PLT 206 202   Cardiac Enzymes: No results for input(s): CKTOTAL, CKMB, CKMBINDEX, TROPONINI in the last 168 hours. BNP: BNP (last 3 results) No results for input(s): BNP in the last 8760 hours.  ProBNP (last 3 results) No results for input(s): PROBNP in the last 8760 hours.  CBG: No results for input(s): GLUCAP in the last 168 hours.     Signed:  Darlin Drop, MD [facility] 08/22/2021, 11:14 AM

## 2021-08-22 NOTE — Plan of Care (Signed)
  Problem: Education: Goal: Knowledge of General Education information will improve Description: Including pain rating scale, medication(s)/side effects and non-pharmacologic comfort measures Outcome: Progressing   Problem: Clinical Measurements: Goal: Will remain free from infection Outcome: Progressing Goal: Diagnostic test results will improve Outcome: Progressing   

## 2021-08-22 NOTE — Progress Notes (Signed)
PROGRESS NOTE  Manuel KicksBernard D Keator Jr. WJX:[id number] DOB: 08/22/1945 DOA: 08/21/2021 PCP: Marina GoodellFeldpausch, Dale E, MD  HPI/Recap of past 24 hours: Manuel KicksBernard D Kean Jr. is a 76 y.o. male with hx of CAD status post four-vessel CABG, chronic systolic CHF, dilated cardiomyopathy, ventricular tachycardia status post dual-chamber ICD placement on 08/16/2021, who presents after an episode of unwitnessed syncope, thought cardiogenic.  Recent history notable for admission [facility] after LifeVest delivered multiple shocks, he was found to have ventricular tachycardia, had a dual-chamber ICD placed during admission, and was started on amiodarone.   In the ED, Cardiology was consulted and interrogated his pacemaker which showed he suffered an episode of ventricular tachycardia that was terminated with defibrillation.  He was admitted for observation.  Seen by cardiology with [facility].  08/22/21: Patient was seen and examined at his bedside.  He denies any anginal symptoms.  No palpitations.  Left upper extremity with mild edema and scrape from his fall, no notable erythema or tenderness with palpation to suggest cellulitis at this time.  Would closely monitor for any signs of cellulitis.  Assessment/Plan: Principal Problem:   VT (ventricular tachycardia) Active Problems:   Benign hypertension   AKI (acute kidney injury) (HCC)   Chronic systolic CHF (congestive heart failure), NYHA class 2 (HCC)   Dilated cardiomyopathy (HCC)   S/P CABG x 4  Manuel KicksBernard D Quevedo Jr. is a 76 y.o. male with hx of CAD status post four-vessel CABG, chronic systolic CHF, dilated cardiomyopathy LVEF 25-35%, ventricular tachycardia status post dual-chamber ICD placement on 08/16/2021, who presents after an syncopal event and was found to have an episode of ventricular tachycardia that was terminated by defibrillation.   #Vtach s/p dual chamber [facility] #CAD s/p 4v CABG #Syncope likely cardiogenic Positive syncope secondary to V. tach terminated by defibrillation.  Appreciate cardiology recommendations. - Continue amiodarone 400 mg twice daily for an additional 3 days followed by 400 mg daily - Increase metoprolol XL to 50 mg twice daily - [facility] - Troponin peaked at 43 - Telemetry monitoring - Continue aspirin, rosuvastatin   #Unwitnessed Fall from syncope Unwitnessed, does not give a good history for head trauma  CT head negative for acute intracranial abnormalities   #Improving AKI, suspect prerenal 2/2 to dehydration Baseline creatinine around 1.3 Presented with creatinine 2 Received IV fluid with improvement of creatinine  Diuretics on hold and off IV fluid Continue to hold PTA po furosemide and spironolactone   #Chronic medical problems BPH-continue Flomax COPD-continue Spiriva, maintain O2 sat>90%  Mild LUE edema No surrounding erythema noted but mild scrapes from fall Afebrile, no leukocytosis, non toxic appearing Monitor for signs of cellulitis, tenderness, warmth, edema Not on antibiotics due to no obvious signs of cellulitis  Critical care time: 65 minutes.   Code Status: Full Code, confirmed DVT Prophylaxis: on full dose anticoagulation Family Communication: Partner updated at bedside Disposition Plan: Observation, Cardiac Tele     Code Status: Full code  Family Communication: Family member at bedside  Disposition Plan: Plan to transfer to [facility] by cardiology   Consultants: Cardiology  Procedures: None  Antimicrobials: None  DVT prophylaxis: Eliquis  Status is: Observation        Objective: Vitals:   08/21/21 2246 08/22/21 0035 08/22/21 0459 08/22/21 0725  BP: (!) 111/57 (!) 110/52 (!) 95/51 106/66  Pulse: 70 70 70  70  Resp:  20 16 17   Temp: 98.1 F (36.7 C) (!) 97.4 F (36.3 C) 98.3 F (36.8 C) 98 F (36.7 C)   TempSrc: Oral Oral    SpO2: 98% 96% 96% 95%  Weight:      Height:        Intake/Output Summary (Last 24 hours) at 08/22/2021 1024 Last data filed at 08/22/2021 1002 Gross per 24 hour  Intake 340 ml  Output 500 ml  Net -160 ml   Filed Weights   08/21/21 1418 08/21/21 2015  Weight: 67.1 kg 64.9 kg    Exam:  General: 76 y.o. year-old male well developed well nourished in no acute distress.  Alert and oriented x3. Cardiovascular: Regular rate and rhythm with no rubs or gallops.  No thyromegaly or JVD noted.   Respiratory: Clear to auscultation with no wheezes or rales. Good inspiratory effort. Abdomen: Soft nontender nondistended with normal bowel sounds x4 quadrants. Musculoskeletal: No lower extremity edema. 2/4 pulses in all 4 extremities. Skin: No ulcerative lesions noted or rashes, Psychiatry: Mood is appropriate for condition and setting   Data Reviewed: CBC: Recent Labs  Lab 08/21/21 1431 08/22/21 0528  WBC 9.8 8.9  HGB 11.4* 10.3*  HCT 34.9* 31.2*  MCV 89.7 86.4  PLT 206 202   Basic Metabolic Panel: Recent Labs  Lab 08/21/21 1431 08/22/21 0528  NA 135 135  K 4.3 4.6  CL 103 105  CO2 23 24  GLUCOSE 136* 102*  BUN 48* 37*  CREATININE 2.03* 1.58*  CALCIUM 9.0 8.8*   GFR: Estimated Creatinine Clearance: 37.1 mL/min (A) (by C-G formula based on SCr of 1.58 mg/dL (H)). Liver Function Tests: No results for input(s): AST, ALT, ALKPHOS, BILITOT, PROT, ALBUMIN in the last 168 hours. No results for input(s): LIPASE, AMYLASE in the last 168 hours. No results for input(s): AMMONIA in the last 168 hours. Coagulation Profile: No results for input(s): INR, PROTIME in the last 168 hours. Cardiac Enzymes: No results for input(s): CKTOTAL, CKMB, CKMBINDEX, TROPONINI in the last 168 hours. BNP (last 3 results) No results for input(s): PROBNP in the last 8760 hours. HbA1C: No results for input(s): HGBA1C in the last 72 hours. CBG: No results for input(s): GLUCAP in  the last 168 hours. Lipid Profile: No results for input(s): CHOL, HDL, LDLCALC, TRIG, CHOLHDL, LDLDIRECT in the last 72 hours. Thyroid Function Tests: No results for input(s): TSH, T4TOTAL, FREET4, T3FREE, THYROIDAB in the last 72 hours. Anemia Panel: No results for input(s): VITAMINB12, FOLATE, FERRITIN, TIBC, IRON, RETICCTPCT in the last 72 hours. Urine analysis:    Component Value Date/Time   COLORURINE YELLOW 08/21/2021 1431   APPEARANCEUR CLEAR 08/21/2021 1431   LABSPEC 1.010 08/21/2021 1431   PHURINE 6.0 08/21/2021 1431   GLUCOSEU NEGATIVE 08/21/2021 1431   HGBUR MODERATE (A) 08/21/2021 1431   BILIRUBINUR NEGATIVE 08/21/2021 1431   KETONESUR NEGATIVE 08/21/2021 1431   PROTEINUR TRACE (A) 08/21/2021 1431   NITRITE POSITIVE (A) 08/21/2021 1431   LEUKOCYTESUR MODERATE (A) 08/21/2021 1431   Sepsis Labs: @LABRCNTIP (procalcitonin:4,lacticidven:4)  ) Recent Results (from the past 240 hour(s))  Resp Panel by RT-PCR (Flu A&B, Covid) Nasopharyngeal Swab     Status: None   Collection Time: 08/21/21  6:45 PM   Specimen: Nasopharyngeal Swab; Nasopharyngeal(NP) swabs in vial transport medium  Result Value Ref Range Status   SARS Coronavirus 2 by RT PCR NEGATIVE NEGATIVE Final    Comment: (NOTE) SARS-CoV-2 target nucleic acids are NOT DETECTED.  The SARS-CoV-2  RNA is generally detectable in upper respiratory specimens during the acute phase of infection. The lowest concentration of SARS-CoV-2 viral copies this assay can detect is 138 copies/mL. A negative result does not preclude SARS-Cov-2 infection and should not be used as the sole basis for treatment or other patient management decisions. A negative result may occur with  improper specimen collection/handling, submission of specimen other than nasopharyngeal swab, presence of viral mutation(s) within the areas targeted by this assay, and inadequate number of viral copies(<138 copies/mL). A negative result must be combined  with clinical observations, patient history, and epidemiological information. The expected result is Negative.  Fact Sheet for Patients:  BloggerCourse.com  Fact Sheet for Healthcare Providers:  SeriousBroker.it  This test is no t yet approved or cleared by the [place] FDA and  has been authorized for detection and/or diagnosis of SARS-CoV-2 by FDA under an Emergency Use Authorization (EUA). This EUA will remain  in effect (meaning this test can be used) for the duration of the COVID-19 declaration under Section 564(b)(1) of the Act, 21 U.S.C.section 360bbb-3(b)(1), unless the authorization is terminated  or revoked sooner.       Influenza A by PCR NEGATIVE NEGATIVE Final   Influenza B by PCR NEGATIVE NEGATIVE Final    Comment: (NOTE) The Xpert Xpress SARS-CoV-2/FLU/RSV plus assay is intended as an aid in the diagnosis of influenza from Nasopharyngeal swab specimens and should not be used as a sole basis for treatment. Nasal washings and aspirates are unacceptable for Xpert Xpress SARS-CoV-2/FLU/RSV testing.  Fact Sheet for Patients: BloggerCourse.com  Fact Sheet for Healthcare Providers: SeriousBroker.it  This test is not yet approved or cleared by the [place] FDA and has been authorized for detection and/or diagnosis of SARS-CoV-2 by FDA under an Emergency Use Authorization (EUA). This EUA will remain in effect (meaning this test can be used) for the duration of the COVID-19 declaration under Section 564(b)(1) of the Act, 21 U.S.C. section 360bbb-3(b)(1), unless the authorization is terminated or revoked.  Performed at [facility], [address], [place], [place] [address]       Studies: CT Head Wo Contrast  Result Date: 08/21/2021 CLINICAL DATA:  Fall, head trauma. EXAM: CT HEAD WITHOUT CONTRAST TECHNIQUE: Contiguous axial images were obtained  from the base of the skull through the vertex without intravenous contrast. RADIATION DOSE REDUCTION: This exam was performed according to the departmental dose-optimization program which includes automated exposure control, adjustment of the mA and/or kV according to patient size and/or use of iterative reconstruction technique. COMPARISON:  None. FINDINGS: Brain: Generalized age related parenchymal volume loss with commensurate dilatation of the ventricles and sulci. Minimal chronic small vessel ischemic change within the deep periventricular white matter regions. No mass, hemorrhage, edema or other evidence of acute parenchymal abnormality. No extra-axial hemorrhage. Vascular: Chronic calcified atherosclerotic changes of the large vessels at the skull base. No unexpected hyperdense vessel. Skull: Normal. Negative for fracture or focal lesion. Sinuses/Orbits: No acute finding. Other: None. IMPRESSION: No acute findings. No intracranial mass, hemorrhage or edema. No skull fracture. Electronically Signed   By: Bary Richard M.D.   On: 08/21/2021 18:58    Scheduled Meds:  amiodarone  400 mg Oral BID   Followed by   Melene Muller ON 08/26/2021] amiodarone  400 mg Oral Daily   apixaban  5 mg Oral BID   aspirin EC  81 mg Oral Daily   metoprolol succinate  50 mg Oral BID   polymixin-bacitracin   Topical BID  rosuvastatin  40 mg Oral Daily   sodium chloride flush  3 mL Intravenous Q12H   tamsulosin  0.4 mg Oral Daily   tiotropium  18 mcg Inhalation Q1200    Continuous Infusions:   LOS: 0 days     Darlin Drop, MD [facility] Pager [phone]  If 7PM-7AM, please contact night-coverage [email or web address] Password [facility] 08/22/2021, 10:24 AM

## 2021-08-22 NOTE — Consult Note (Signed)
[facility] Cardiology  CARDIOLOGY CONSULT NOTE  Patient ID: Manuel Bowen. MRN: [id number] DOB/AGE: [birth year] 76 y.o.  Admit date: 08/21/2021 Referring Physician Manuel Bowen Primary Physician Manuel Goodell, MD Primary Cardiologist Manuel Bowen Reason for Consultation VT, syncope  HPI:  Manuel Bowen is a 76 year old male with a history of CAD s/p CABG 04/2021, dilated cardiomyopathy ( EF 24%) s/p dual-chamber ICD on 08/16/2021, VT, atrial fibrillation who presents following an episode of syncope for which his device showed ventricular tachycardia as the etiology.  Interval history - No acute events overnight.  - He is worried about having recurrent episode and possibly dying from it next time.  - No chest pain or shortness of breath.   Review of systems complete and found to be negative unless listed above     Past Medical History:  Diagnosis Date   Chronic kidney disease    stones   COPD (chronic obstructive pulmonary disease) (HCC)    Diverticulosis    History of kidney stones    Hypertension    Inguinal hernia    Shortness of breath dyspnea     Past Surgical History:  Procedure Laterality Date   CATARACT EXTRACTION W/PHACO Right 06/25/2015   Procedure: CATARACT EXTRACTION PHACO AND INTRAOCULAR LENS PLACEMENT (IOC);  Surgeon: Lia Hopping, MD;  Location: [facility] ORS;  Service: Ophthalmology;  Laterality: Right;  [place]: 01:23.0 AP%: 13.0 CDE: 10.75 Lot # [id number]   CATARACT EXTRACTION W/PHACO Left 04/14/2016   Procedure: CATARACT EXTRACTION PHACO AND INTRAOCULAR LENS PLACEMENT (IOC);  Surgeon: Nevada Crane, MD;  Location: [facility] ORS;  Service: Ophthalmology;  Laterality: Left;  [place] 1.27 AP% 10.8 CDE 9.48 Fluid Pack lot # [id number]   COLONOSCOPY     COLONOSCOPY WITH PROPOFOL N/A 04/27/2020   Procedure: COLONOSCOPY WITH PROPOFOL;  Surgeon: Regis Bill, MD;  Location: [facility] ENDOSCOPY;  Service: Endoscopy;  Laterality: N/A;   EYE SURGERY     GAS/FLUID  EXCHANGE Right 02/11/2015   Procedure: GAS/FLUID EXCHANGE;  Surgeon: Marcelene Butte, MD;  Location: [facility] ORS;  Service: Ophthalmology;  Laterality: Right;  24%  SF6   LEFT HEART CATH AND CORONARY ANGIOGRAPHY N/A 03/11/2021   Procedure: LEFT HEART CATH AND CORONARY ANGIOGRAPHY;  Surgeon: Marcina Millard, MD;  Location: [facility] INVASIVE CV LAB;  Service: Cardiovascular;  Laterality: N/A;   LITHOTRIPSY     MEMBRANE PEEL Right 02/11/2015   Procedure: MEMBRANE PEEL;  Surgeon: Marcelene Butte, MD;  Location: [facility] ORS;  Service: Ophthalmology;  Laterality: Right;   PARS PLANA VITRECTOMY W/ SCLERAL BUCKLE Right 02/11/2015   Procedure: PARS PLANA VITRECTOMY WITH LASER FOR MACULAR HOLE;  Surgeon: Marcelene Butte, MD;  Location: [facility] ORS;  Service: Ophthalmology;  Laterality: Right;  Power200 Duration200 Interval200 Shot count117 Total energy 4.61 joules    Medications Prior to Admission  Medication Sig Dispense Refill Last Dose   amiodarone (PACERONE) 400 MG tablet Take 400 mg by mouth in the morning and at bedtime.   08/21/2021   aspirin 81 MG tablet Take 81 mg by mouth daily.   08/21/2021   ELIQUIS 5 MG TABS tablet Take 5 mg by mouth 2 (two) times daily.   08/21/2021   furosemide (LASIX) 20 MG tablet Take 20 mg by mouth daily.   08/21/2021   metoprolol succinate (TOPROL-XL) 25 MG 24 hr tablet Take 25 mg by mouth daily.   08/21/2021   Multiple Vitamin (MULTI-VITAMINS) TABS Take 1 tablet by mouth daily.   08/21/2021   rosuvastatin (CRESTOR) 5 MG tablet  Take 5 mg by mouth daily.   08/21/2021   spironolactone (ALDACTONE) 25 MG tablet Take 12.5 mg by mouth daily.   08/21/2021   tamsulosin (FLOMAX) 0.4 MG CAPS capsule Take 0.4 mg by mouth daily.   08/21/2021   tiotropium (SPIRIVA) 18 MCG inhalation capsule Place 18 mcg into inhaler and inhale daily at 12 noon.   08/21/2021   albuterol (VENTOLIN HFA) 108 (90 Base) MCG/ACT inhaler Inhale 2 puffs into the lungs every 6 (six) hours as needed for wheezing or shortness of  breath.   Unknown   sildenafil (VIAGRA) 50 MG tablet Take 50 mg by mouth daily as needed for erectile dysfunction.   Unknown    Social History   Socioeconomic History   Marital status: Widowed    Spouse name: Not on file   Number of children: Not on file   Years of education: Not on file   Highest education level: Not on file  Occupational History   Not on file  Tobacco Use   Smoking status: Former    Packs/day: 1.25    Years: 52.00    Pack years: 65.00    Types: Cigarettes    Quit date: 04/20/2021    Years since quitting: 0.3   Smokeless tobacco: Former   Tobacco comments:    Quit after CABG    Substance and Sexual Activity   Alcohol use: Not Currently    Alcohol/week: 14.0 standard drinks    Types: 14 Standard drinks or equivalent per week   Drug use: Never   Sexual activity: Not on file  Other Topics Concern   Not on file  Social History Narrative   Not on file   Social Determinants of Health   Financial Resource Strain: Not on file  Food Insecurity: Not on file  Transportation Needs: Not on file  Physical Activity: Not on file  Stress: Not on file  Social Connections: Not on file  Intimate Partner Violence: Not on file    History reviewed. No pertinent family history.    Review of systems complete and found to be negative unless listed above      PHYSICAL EXAM  General: Well developed, well nourished, in no acute distress HEENT:  Normocephalic and atramatic Neck:  No JVD.  Lungs: Clear bilaterally to auscultation and percussion. Heart: HRRR . Normal S1 and S2 without gallops or murmurs. Left chest wall with some swelling and erythema over ICD site. Abdomen: Bowel sounds are positive, abdomen soft and non-tender  Msk:  Back normal, normal gait. Normal strength and tone for age. Extremities: Left arm is swollen. A few minor abraisons.  Neuro: Alert and oriented X 3. Psych:  Good affect, responds appropriately  Labs:   Lab Results  Component Value  Date   WBC 8.9 08/22/2021   HGB 10.3 (L) 08/22/2021   HCT 31.2 (L) 08/22/2021   MCV 86.4 08/22/2021   PLT 202 08/22/2021    Recent Labs  Lab 08/22/21 0528  NA 135  K 4.6  CL 105  CO2 24  BUN 37*  CREATININE 1.58*  CALCIUM 8.8*  GLUCOSE 102*    No results found for: CKTOTAL, CKMB, CKMBINDEX, TROPONINI No results found for: CHOL No results found for: HDL No results found for: LDLCALC No results found for: TRIG No results found for: CHOLHDL No results found for: LDLDIRECT    Radiology: CT Head Wo Contrast  Result Date: 08/21/2021 CLINICAL DATA:  Fall, head trauma. EXAM: CT HEAD WITHOUT CONTRAST TECHNIQUE: Contiguous  axial images were obtained from the base of the skull through the vertex without intravenous contrast. RADIATION DOSE REDUCTION: This exam was performed according to the departmental dose-optimization program which includes automated exposure control, adjustment of the mA and/or kV according to patient size and/or use of iterative reconstruction technique. COMPARISON:  None. FINDINGS: Brain: Generalized age related parenchymal volume loss with commensurate dilatation of the ventricles and sulci. Minimal chronic small vessel ischemic change within the deep periventricular white matter regions. No mass, hemorrhage, edema or other evidence of acute parenchymal abnormality. No extra-axial hemorrhage. Vascular: Chronic calcified atherosclerotic changes of the large vessels at the skull base. No unexpected hyperdense vessel. Skull: Normal. Negative for fracture or focal lesion. Sinuses/Orbits: No acute finding. Other: None. IMPRESSION: No acute findings. No intracranial mass, hemorrhage or edema. No skull fracture. Electronically Signed   By: Bary Richard M.D.   On: 08/21/2021 18:58    EKG: Unable to view at present.  ASSESSMENT AND PLAN:  Manuel Bowen is a 76 year old male with a history of CAD s/p CABG 04/2021, dilated cardiomyopathy ( EF 24%) s/p dual-chamber ICD on  08/16/2021, VT, atrial fibrillation who presents following an episode of syncope for which his device showed ventricular tachycardia as the etiology.  #Ischemic cardiomyopathy #Ventricular tachycardia Recently had ventricular tachycardia on 08/12/2021 while wearing a LifeVest and was started on an amiodarone load.  He had a dual-chamber ICD placed on 08/16/2021 at [facility], was doing well until 08/21/2021 when he had syncope.  Device interrogation shows ventricular tachycardia which was terminated by defibrillation.  Recently had a cardiac MRI that showed scar in the LAD and RCA distributions. Denies any chest pain suggestive of an ischemic event. -Continue amiodarone 400 mg twice daily x3 additional days.  Then 400 mg daily. -If he has recurrent VT while in the hospital recommend IV bolus plus infusion for additional loading. -Increase metoprolol XL 50 mg BID -Will transfer pateint to [facility] for inpatient EP consultation -Troponin ~40 on 2 checks; suggestive against ischemic etiology -Monitor on telemetry  #CAD s/p CABG x4  CABG x 4 on 05/05/2021 by Dr. Epifania Gore at [facility] with LIMA- LAD, SVG-RPDA, sequential SVG-OM3/OM1, with left atrial appendage ligation.  -Continue aspirin 81 mg -Metoprolol  -Continue Crestor 5 mg -Trend troponin until peak as above. -Does not appear to be a clear indication for repeat ischemic evaluation at this time.  Likely scar mediated VT.  #Atrial fibrillation He is status post left atrial appendage ligation during CABG. -Continue Eliquis 5 mg twice daily -Continue amiodarone and beta-blocker as above.  Signed: Armando Reichert MD 08/22/2021, 9:54 AM

## 2021-08-22 NOTE — Progress Notes (Signed)
°  Transition of Care [facility]) Screening Note   Patient Details  Name: Manuel Bowen. Date of Birth: 07/08/1946   Transition of Care [facility]) CM/SW Contact:    Gildardo Griffes, LCSW Phone Number: 08/22/2021, 9:26 AM    Transition of Care Department [facility]) has reviewed patient and no TOC needs have been identified at this time. We will continue to monitor patient advancement through interdisciplinary progression rounds. If new patient transition needs arise, please place a TOC consult.  [place], [place] [phone]

## 2021-08-23 DIAGNOSIS — Z720 Tobacco use: Secondary | ICD-10-CM | POA: Diagnosis not present

## 2021-08-23 DIAGNOSIS — I951 Orthostatic hypotension: Secondary | ICD-10-CM | POA: Diagnosis not present

## 2021-08-23 DIAGNOSIS — I110 Hypertensive heart disease with heart failure: Secondary | ICD-10-CM | POA: Diagnosis not present

## 2021-08-23 DIAGNOSIS — S50319A Abrasion of unspecified elbow, initial encounter: Secondary | ICD-10-CM | POA: Diagnosis not present

## 2021-08-23 DIAGNOSIS — Z7982 Long term (current) use of aspirin: Secondary | ICD-10-CM | POA: Diagnosis not present

## 2021-08-23 DIAGNOSIS — Z515 Encounter for palliative care: Secondary | ICD-10-CM | POA: Diagnosis not present

## 2021-08-23 DIAGNOSIS — I361 Nonrheumatic tricuspid (valve) insufficiency: Secondary | ICD-10-CM | POA: Diagnosis not present

## 2021-08-23 DIAGNOSIS — R55 Syncope and collapse: Secondary | ICD-10-CM | POA: Diagnosis not present

## 2021-08-23 DIAGNOSIS — S2020XA Contusion of thorax, unspecified, initial encounter: Secondary | ICD-10-CM | POA: Diagnosis not present

## 2021-08-23 DIAGNOSIS — Z9911 Dependence on respirator [ventilator] status: Secondary | ICD-10-CM | POA: Diagnosis not present

## 2021-08-23 DIAGNOSIS — N189 Chronic kidney disease, unspecified: Secondary | ICD-10-CM | POA: Diagnosis not present

## 2021-08-23 DIAGNOSIS — Z7901 Long term (current) use of anticoagulants: Secondary | ICD-10-CM | POA: Diagnosis not present

## 2021-08-23 DIAGNOSIS — W1839XA Other fall on same level, initial encounter: Secondary | ICD-10-CM | POA: Diagnosis not present

## 2021-08-23 DIAGNOSIS — I509 Heart failure, unspecified: Secondary | ICD-10-CM | POA: Diagnosis not present

## 2021-08-23 DIAGNOSIS — N179 Acute kidney failure, unspecified: Secondary | ICD-10-CM | POA: Diagnosis not present

## 2021-08-23 DIAGNOSIS — R42 Dizziness and giddiness: Secondary | ICD-10-CM | POA: Diagnosis not present

## 2021-08-23 DIAGNOSIS — Z66 Do not resuscitate: Secondary | ICD-10-CM | POA: Diagnosis not present

## 2021-08-23 DIAGNOSIS — Z87891 Personal history of nicotine dependence: Secondary | ICD-10-CM | POA: Diagnosis not present

## 2021-08-23 DIAGNOSIS — N400 Enlarged prostate without lower urinary tract symptoms: Secondary | ICD-10-CM | POA: Diagnosis not present

## 2021-08-23 DIAGNOSIS — I472 Ventricular tachycardia, unspecified: Secondary | ICD-10-CM | POA: Diagnosis not present

## 2021-08-23 DIAGNOSIS — W19XXXA Unspecified fall, initial encounter: Secondary | ICD-10-CM | POA: Diagnosis not present

## 2021-08-23 DIAGNOSIS — Z95810 Presence of automatic (implantable) cardiac defibrillator: Secondary | ICD-10-CM | POA: Diagnosis not present

## 2021-08-23 DIAGNOSIS — M4852XA Collapsed vertebra, not elsewhere classified, cervical region, initial encounter for fracture: Secondary | ICD-10-CM | POA: Diagnosis not present

## 2021-08-23 DIAGNOSIS — J449 Chronic obstructive pulmonary disease, unspecified: Secondary | ICD-10-CM | POA: Diagnosis not present

## 2021-08-23 DIAGNOSIS — S3993XA Unspecified injury of pelvis, initial encounter: Secondary | ICD-10-CM | POA: Diagnosis not present

## 2021-08-23 DIAGNOSIS — I5023 Acute on chronic systolic (congestive) heart failure: Secondary | ICD-10-CM | POA: Diagnosis not present

## 2021-08-23 DIAGNOSIS — I499 Cardiac arrhythmia, unspecified: Secondary | ICD-10-CM | POA: Diagnosis not present

## 2021-08-23 DIAGNOSIS — I2510 Atherosclerotic heart disease of native coronary artery without angina pectoris: Secondary | ICD-10-CM | POA: Diagnosis not present

## 2021-08-23 DIAGNOSIS — I340 Nonrheumatic mitral (valve) insufficiency: Secondary | ICD-10-CM | POA: Diagnosis not present

## 2021-08-23 DIAGNOSIS — R9431 Abnormal electrocardiogram [ECG] [EKG]: Secondary | ICD-10-CM | POA: Diagnosis not present

## 2021-08-23 DIAGNOSIS — I25810 Atherosclerosis of coronary artery bypass graft(s) without angina pectoris: Secondary | ICD-10-CM | POA: Diagnosis not present

## 2021-08-23 DIAGNOSIS — I420 Dilated cardiomyopathy: Secondary | ICD-10-CM | POA: Diagnosis not present

## 2021-08-23 DIAGNOSIS — S3991XA Unspecified injury of abdomen, initial encounter: Secondary | ICD-10-CM | POA: Diagnosis not present

## 2021-08-23 DIAGNOSIS — S0990XA Unspecified injury of head, initial encounter: Secondary | ICD-10-CM | POA: Diagnosis not present

## 2021-08-23 DIAGNOSIS — I5181 Takotsubo syndrome: Secondary | ICD-10-CM | POA: Diagnosis not present

## 2021-08-23 DIAGNOSIS — I5042 Chronic combined systolic (congestive) and diastolic (congestive) heart failure: Secondary | ICD-10-CM | POA: Diagnosis not present

## 2021-08-23 DIAGNOSIS — D631 Anemia in chronic kidney disease: Secondary | ICD-10-CM | POA: Diagnosis not present

## 2021-08-23 DIAGNOSIS — N183 Chronic kidney disease, stage 3 unspecified: Secondary | ICD-10-CM | POA: Diagnosis not present

## 2021-08-23 DIAGNOSIS — I440 Atrioventricular block, first degree: Secondary | ICD-10-CM | POA: Diagnosis not present

## 2021-08-23 DIAGNOSIS — I4589 Other specified conduction disorders: Secondary | ICD-10-CM | POA: Diagnosis not present

## 2021-08-23 DIAGNOSIS — I4901 Ventricular fibrillation: Secondary | ICD-10-CM | POA: Diagnosis not present

## 2021-08-23 DIAGNOSIS — S34109A Unspecified injury to unspecified level of lumbar spinal cord, initial encounter: Secondary | ICD-10-CM | POA: Diagnosis not present

## 2021-08-23 DIAGNOSIS — S2242XA Multiple fractures of ribs, left side, initial encounter for closed fracture: Secondary | ICD-10-CM | POA: Diagnosis not present

## 2021-08-23 DIAGNOSIS — R911 Solitary pulmonary nodule: Secondary | ICD-10-CM | POA: Diagnosis not present

## 2021-08-23 DIAGNOSIS — E860 Dehydration: Secondary | ICD-10-CM | POA: Diagnosis not present

## 2021-08-23 DIAGNOSIS — R570 Cardiogenic shock: Secondary | ICD-10-CM | POA: Diagnosis not present

## 2021-08-23 DIAGNOSIS — I130 Hypertensive heart and chronic kidney disease with heart failure and stage 1 through stage 4 chronic kidney disease, or unspecified chronic kidney disease: Secondary | ICD-10-CM | POA: Diagnosis not present

## 2021-08-23 DIAGNOSIS — I252 Old myocardial infarction: Secondary | ICD-10-CM | POA: Diagnosis not present

## 2021-08-23 DIAGNOSIS — I4891 Unspecified atrial fibrillation: Secondary | ICD-10-CM | POA: Diagnosis not present

## 2021-08-23 DIAGNOSIS — J90 Pleural effusion, not elsewhere classified: Secondary | ICD-10-CM | POA: Diagnosis not present

## 2021-08-23 DIAGNOSIS — I82612 Acute embolism and thrombosis of superficial veins of left upper extremity: Secondary | ICD-10-CM | POA: Diagnosis not present

## 2021-08-23 DIAGNOSIS — I5022 Chronic systolic (congestive) heart failure: Secondary | ICD-10-CM | POA: Diagnosis not present

## 2021-08-23 DIAGNOSIS — Z951 Presence of aortocoronary bypass graft: Secondary | ICD-10-CM | POA: Diagnosis not present

## 2021-08-23 DIAGNOSIS — I459 Conduction disorder, unspecified: Secondary | ICD-10-CM | POA: Diagnosis not present

## 2021-08-23 DIAGNOSIS — I255 Ischemic cardiomyopathy: Secondary | ICD-10-CM | POA: Diagnosis not present

## 2021-08-23 DIAGNOSIS — Z79899 Other long term (current) drug therapy: Secondary | ICD-10-CM | POA: Diagnosis not present

## 2021-08-23 DIAGNOSIS — E785 Hyperlipidemia, unspecified: Secondary | ICD-10-CM | POA: Diagnosis not present

## 2021-08-24 DIAGNOSIS — I110 Hypertensive heart disease with heart failure: Secondary | ICD-10-CM | POA: Diagnosis not present

## 2021-08-24 DIAGNOSIS — Z951 Presence of aortocoronary bypass graft: Secondary | ICD-10-CM | POA: Diagnosis not present

## 2021-08-24 DIAGNOSIS — I4891 Unspecified atrial fibrillation: Secondary | ICD-10-CM | POA: Diagnosis not present

## 2021-08-24 DIAGNOSIS — I5181 Takotsubo syndrome: Secondary | ICD-10-CM | POA: Diagnosis not present

## 2021-08-24 DIAGNOSIS — Z7901 Long term (current) use of anticoagulants: Secondary | ICD-10-CM | POA: Diagnosis not present

## 2021-08-24 DIAGNOSIS — I361 Nonrheumatic tricuspid (valve) insufficiency: Secondary | ICD-10-CM | POA: Diagnosis not present

## 2021-08-24 DIAGNOSIS — Z95810 Presence of automatic (implantable) cardiac defibrillator: Secondary | ICD-10-CM | POA: Diagnosis not present

## 2021-08-24 DIAGNOSIS — E785 Hyperlipidemia, unspecified: Secondary | ICD-10-CM | POA: Diagnosis not present

## 2021-08-24 DIAGNOSIS — I340 Nonrheumatic mitral (valve) insufficiency: Secondary | ICD-10-CM | POA: Diagnosis not present

## 2021-08-24 DIAGNOSIS — I5023 Acute on chronic systolic (congestive) heart failure: Secondary | ICD-10-CM | POA: Diagnosis not present

## 2021-08-24 DIAGNOSIS — Z720 Tobacco use: Secondary | ICD-10-CM | POA: Diagnosis not present

## 2021-08-24 DIAGNOSIS — I2510 Atherosclerotic heart disease of native coronary artery without angina pectoris: Secondary | ICD-10-CM | POA: Diagnosis not present

## 2021-08-24 DIAGNOSIS — I472 Ventricular tachycardia, unspecified: Secondary | ICD-10-CM | POA: Diagnosis not present

## 2021-08-27 DIAGNOSIS — R42 Dizziness and giddiness: Secondary | ICD-10-CM | POA: Diagnosis not present

## 2021-08-27 DIAGNOSIS — S3993XA Unspecified injury of pelvis, initial encounter: Secondary | ICD-10-CM | POA: Diagnosis not present

## 2021-08-27 DIAGNOSIS — S3991XA Unspecified injury of abdomen, initial encounter: Secondary | ICD-10-CM | POA: Diagnosis not present

## 2021-08-27 DIAGNOSIS — S2242XA Multiple fractures of ribs, left side, initial encounter for closed fracture: Secondary | ICD-10-CM | POA: Diagnosis not present

## 2021-08-27 DIAGNOSIS — J90 Pleural effusion, not elsewhere classified: Secondary | ICD-10-CM | POA: Diagnosis not present

## 2021-08-27 DIAGNOSIS — R9431 Abnormal electrocardiogram [ECG] [EKG]: Secondary | ICD-10-CM | POA: Diagnosis not present

## 2021-08-27 DIAGNOSIS — I459 Conduction disorder, unspecified: Secondary | ICD-10-CM | POA: Diagnosis not present

## 2021-08-27 DIAGNOSIS — M4852XA Collapsed vertebra, not elsewhere classified, cervical region, initial encounter for fracture: Secondary | ICD-10-CM | POA: Diagnosis not present

## 2021-08-27 DIAGNOSIS — S34109A Unspecified injury to unspecified level of lumbar spinal cord, initial encounter: Secondary | ICD-10-CM | POA: Diagnosis not present

## 2021-08-27 DIAGNOSIS — R911 Solitary pulmonary nodule: Secondary | ICD-10-CM | POA: Diagnosis not present

## 2021-08-27 DIAGNOSIS — R55 Syncope and collapse: Secondary | ICD-10-CM | POA: Diagnosis not present

## 2021-08-27 DIAGNOSIS — W19XXXA Unspecified fall, initial encounter: Secondary | ICD-10-CM | POA: Diagnosis not present

## 2021-08-28 DIAGNOSIS — I5022 Chronic systolic (congestive) heart failure: Secondary | ICD-10-CM | POA: Diagnosis not present

## 2021-08-28 DIAGNOSIS — I472 Ventricular tachycardia, unspecified: Secondary | ICD-10-CM | POA: Diagnosis not present

## 2021-08-28 DIAGNOSIS — Z951 Presence of aortocoronary bypass graft: Secondary | ICD-10-CM | POA: Diagnosis not present

## 2021-08-29 DIAGNOSIS — Z951 Presence of aortocoronary bypass graft: Secondary | ICD-10-CM | POA: Diagnosis not present

## 2021-08-29 DIAGNOSIS — I5022 Chronic systolic (congestive) heart failure: Secondary | ICD-10-CM | POA: Diagnosis not present

## 2021-08-29 DIAGNOSIS — I472 Ventricular tachycardia, unspecified: Secondary | ICD-10-CM | POA: Diagnosis not present

## 2021-08-30 DIAGNOSIS — Z95810 Presence of automatic (implantable) cardiac defibrillator: Secondary | ICD-10-CM | POA: Diagnosis not present

## 2021-08-30 DIAGNOSIS — I5022 Chronic systolic (congestive) heart failure: Secondary | ICD-10-CM | POA: Diagnosis not present

## 2021-08-30 DIAGNOSIS — I472 Ventricular tachycardia, unspecified: Secondary | ICD-10-CM | POA: Diagnosis not present

## 2021-08-30 DIAGNOSIS — I420 Dilated cardiomyopathy: Secondary | ICD-10-CM | POA: Diagnosis not present

## 2021-08-30 DIAGNOSIS — I4891 Unspecified atrial fibrillation: Secondary | ICD-10-CM | POA: Diagnosis not present

## 2021-08-30 DIAGNOSIS — Z951 Presence of aortocoronary bypass graft: Secondary | ICD-10-CM | POA: Diagnosis not present

## 2021-08-30 DIAGNOSIS — Z7901 Long term (current) use of anticoagulants: Secondary | ICD-10-CM | POA: Diagnosis not present

## 2021-08-30 DIAGNOSIS — I2510 Atherosclerotic heart disease of native coronary artery without angina pectoris: Secondary | ICD-10-CM | POA: Diagnosis not present

## 2021-08-30 DIAGNOSIS — I5023 Acute on chronic systolic (congestive) heart failure: Secondary | ICD-10-CM | POA: Diagnosis not present

## 2021-08-31 DIAGNOSIS — I472 Ventricular tachycardia, unspecified: Secondary | ICD-10-CM | POA: Diagnosis not present

## 2021-08-31 DIAGNOSIS — Z9911 Dependence on respirator [ventilator] status: Secondary | ICD-10-CM | POA: Diagnosis not present

## 2021-08-31 DIAGNOSIS — I5022 Chronic systolic (congestive) heart failure: Secondary | ICD-10-CM | POA: Diagnosis not present

## 2021-08-31 DIAGNOSIS — Z951 Presence of aortocoronary bypass graft: Secondary | ICD-10-CM | POA: Diagnosis not present

## 2021-09-15 DEATH — deceased

## 2021-09-30 ENCOUNTER — Telehealth: Payer: Self-pay | Admitting: *Deleted

## 2021-09-30 NOTE — Telephone Encounter (Signed)
LMTC to schedule Yearly Lung CA CT Scan. 

## 2022-11-04 IMAGING — CT CT HEAD W/O CM
4 series · 16 of 47 positions shown, 18 images · non-contrast
Comparison: None.

CLINICAL DATA: Fall, head trauma.



[Series 2: head wo · axial · 0.44mm/px · z∈[-69,+51]mm · 7 of 32 slices shown, 9 images]
[im 4/32  brain]
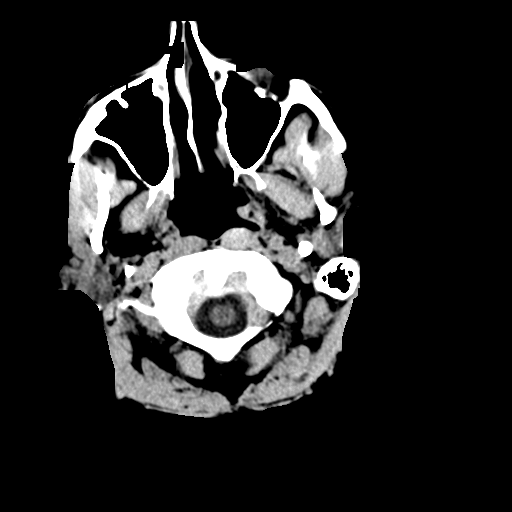
[im 4/32  bone]
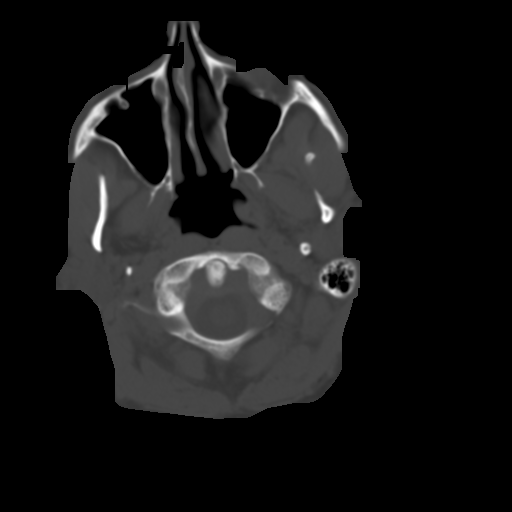
[im 8/32  brain]
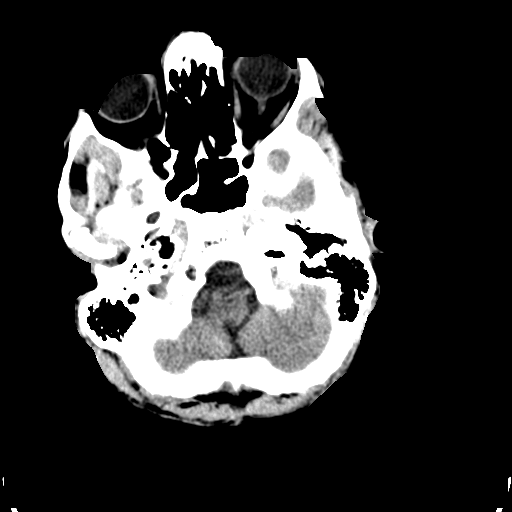
[im 12/32  brain]
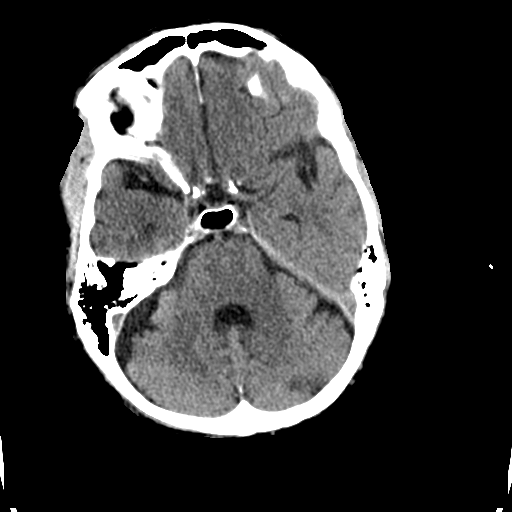
[im 16/32  brain]
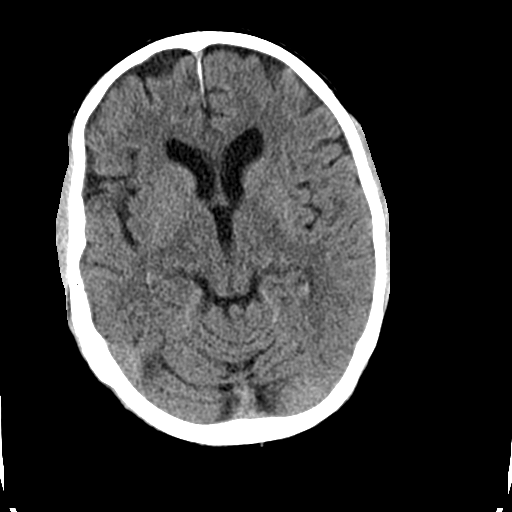
[im 20/32  brain]
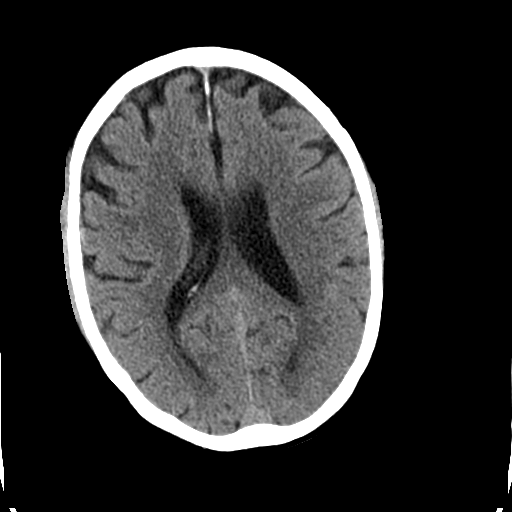
[im 20/32  bone]
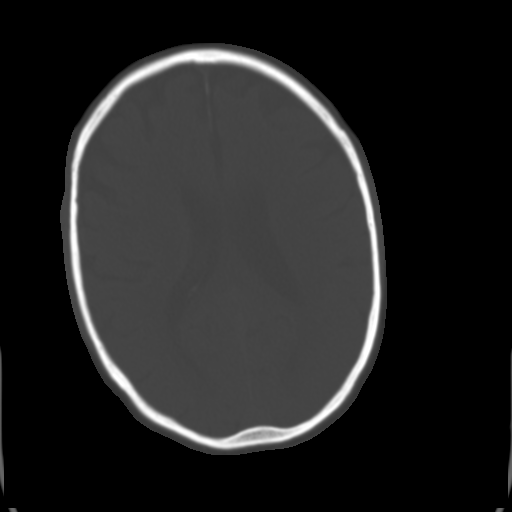
[im 24/32  brain]
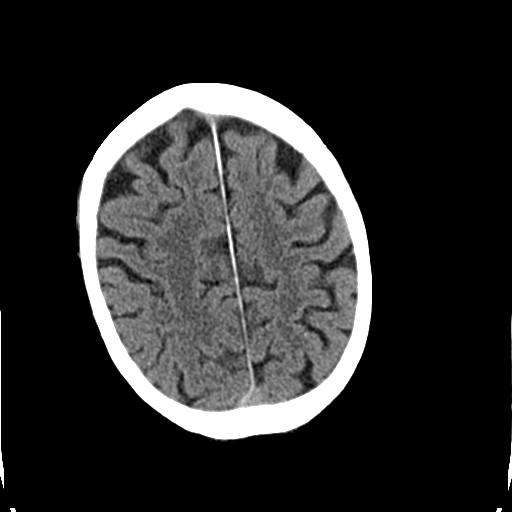
[im 28/32  brain]
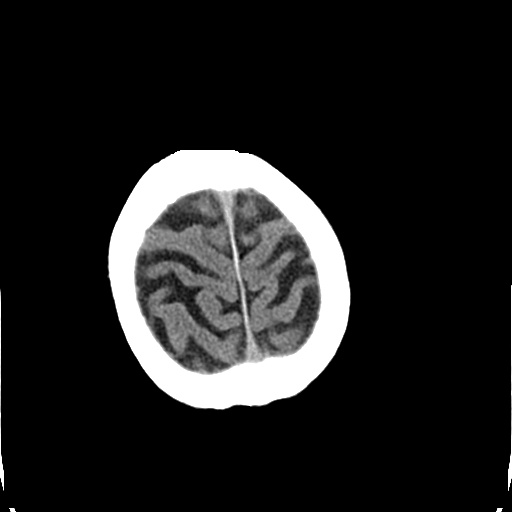

[Series 3: head bone · axial · 0.44mm/px · z∈[-70,-38]mm · 3 of 78 slices shown]
[im 8/78  bone]
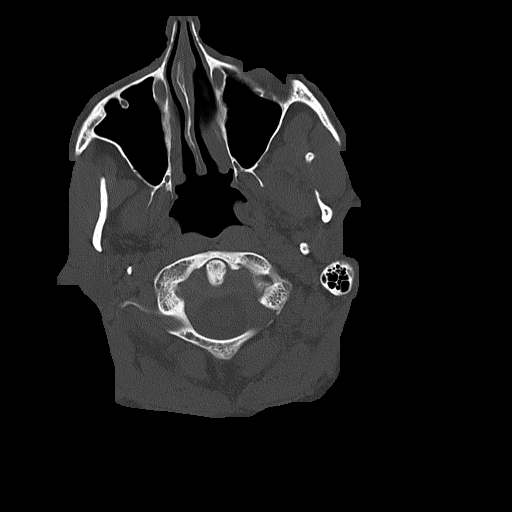
[im 16/78  bone]
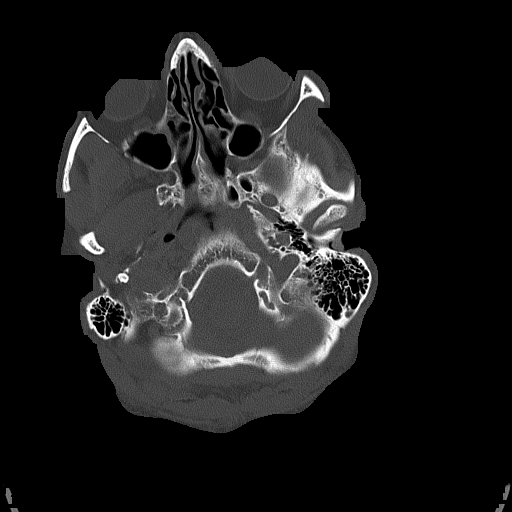
[im 24/78  bone]
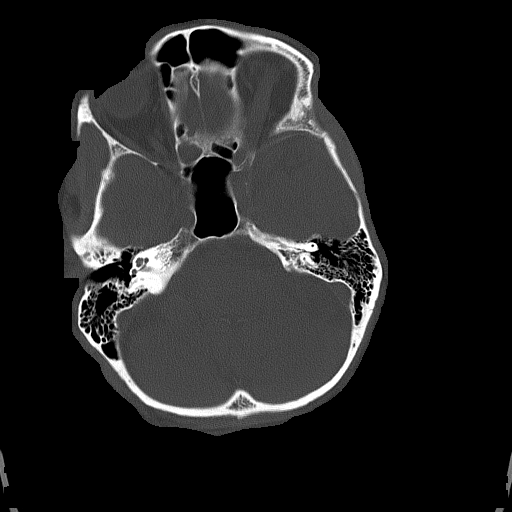

[Series 4: coronal soft tissue · coronal · 0.33mm/px · 3 of 69 slices shown]
[im 23/69  brain]
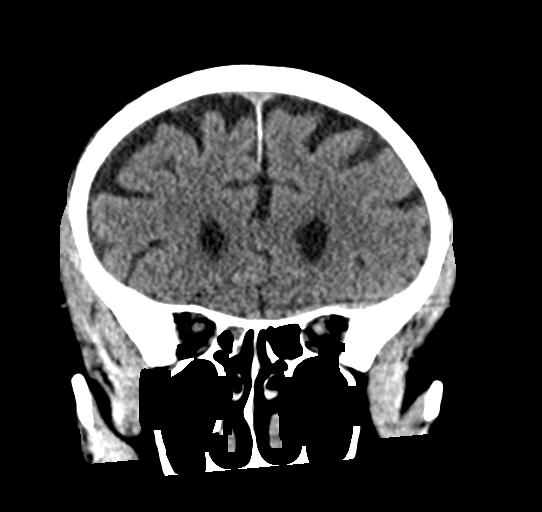
[im 31/69  brain]
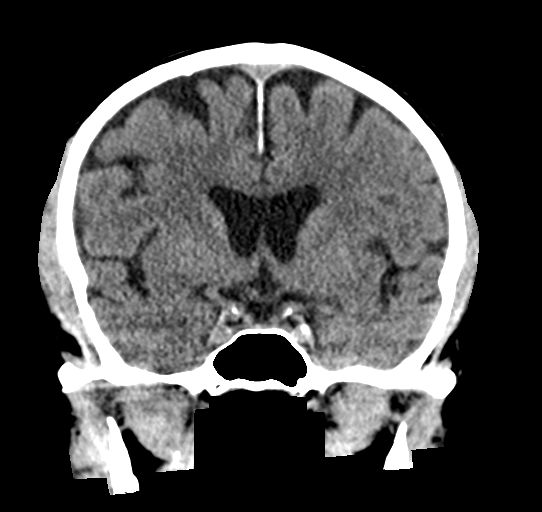
[im 38/69  brain]
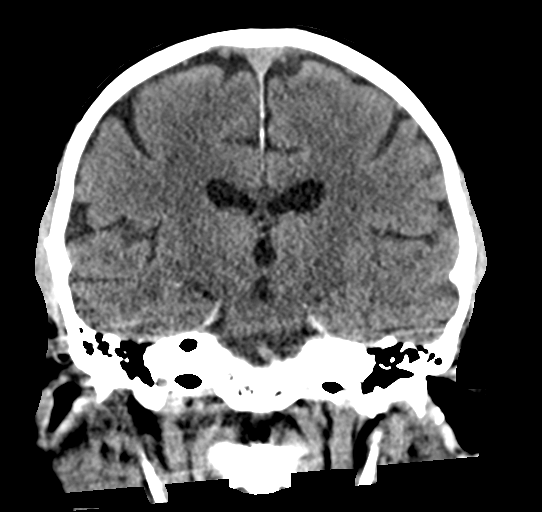

[Series 5: sagittal soft tissue · sagittal · 0.33mm/px · 3 of 58 slices shown]
[im 20/58  brain]
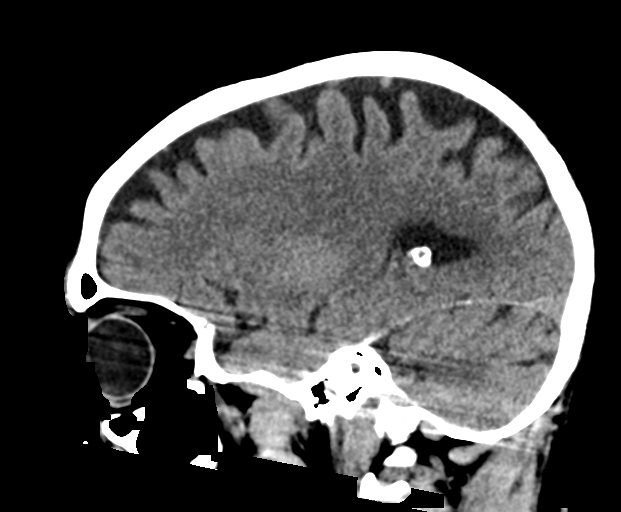
[im 29/58  brain]
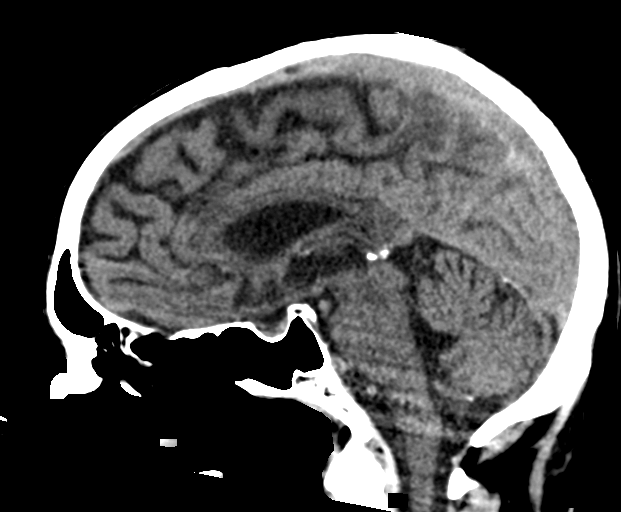
[im 39/58  brain]
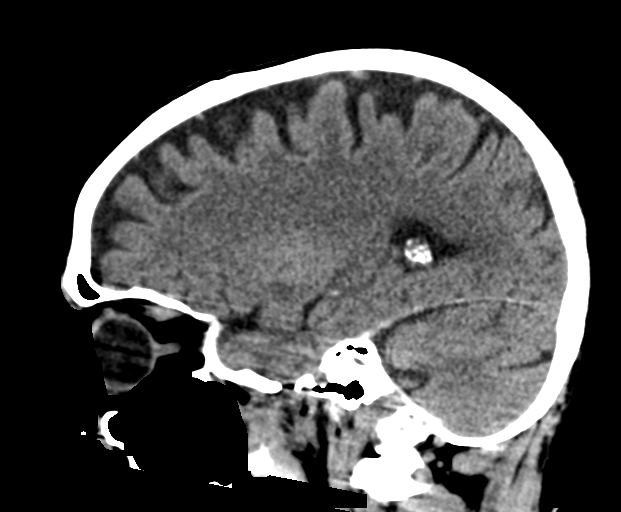

[16 of 47 positions shown; findings below may reference images not displayed]

FINDINGS: Brain: Generalized age related parenchymal volume loss with
commensurate dilatation of the ventricles and sulci. Minimal chronic
small vessel ischemic change within the deep periventricular white
matter regions. No mass, hemorrhage, edema or other evidence of
acute parenchymal abnormality. No extra-axial hemorrhage.

Vascular: Chronic calcified atherosclerotic changes of the large
vessels at the skull base. No unexpected hyperdense vessel.

Skull: Normal. Negative for fracture or focal lesion.

Sinuses/Orbits: No acute finding.

Other: None.
IMPRESSION: No acute findings. No intracranial mass, hemorrhage or edema. No
skull fracture.
